# Patient Record
Sex: Female | Born: 1985 | Race: Black or African American | Hispanic: No | Marital: Single | Smoking: Current every day smoker
Health system: Southern US, Community
[De-identification: ages and names within clinical notes are randomized; demographics above are authoritative.]

---

## 2014-07-07 ENCOUNTER — Emergency Department (HOSPITAL_BASED_OUTPATIENT_CLINIC_OR_DEPARTMENT_OTHER): Payer: 59

## 2014-07-07 ENCOUNTER — Emergency Department (HOSPITAL_BASED_OUTPATIENT_CLINIC_OR_DEPARTMENT_OTHER)
Admission: EM | Admit: 2014-07-07 | Discharge: 2014-07-07 | Disposition: A | Discharge status: Home / Self Care | Payer: 59 | Attending: Emergency Medicine | Admitting: Emergency Medicine

## 2014-07-07 ENCOUNTER — Encounter (HOSPITAL_BASED_OUTPATIENT_CLINIC_OR_DEPARTMENT_OTHER): Payer: Self-pay | Admitting: Emergency Medicine

## 2014-07-07 DIAGNOSIS — J069 Acute upper respiratory infection, unspecified: Secondary | ICD-10-CM | POA: Diagnosis not present

## 2014-07-07 DIAGNOSIS — Z720 Tobacco use: Secondary | ICD-10-CM | POA: Diagnosis not present

## 2014-07-07 DIAGNOSIS — R1110 Vomiting, unspecified: Secondary | ICD-10-CM | POA: Insufficient documentation

## 2014-07-07 DIAGNOSIS — R05 Cough: Secondary | ICD-10-CM | POA: Diagnosis present

## 2014-07-07 NOTE — ED Notes (Signed)
Cough and congestion x 1 week ago

## 2014-07-07 NOTE — ED Notes (Signed)
Pt has cough for one week. No fever.  No runny nose.  Some chest soreness with coughing.  No sob.

## 2014-07-07 NOTE — Discharge Instructions (Signed)
Upper Respiratory Infection, Adult An upper respiratory infection (URI) is also sometimes known as the common cold. The upper respiratory tract includes the nose, sinuses, throat, trachea, and bronchi. Bronchi are the airways leading to the lungs. Most people improve within 1 week, but symptoms can last up to 2 weeks. A residual cough may last even longer.  CAUSES Many different viruses can infect the tissues lining the upper respiratory tract. The tissues become irritated and inflamed and often become very moist. Mucus production is also common. A cold is contagious. You can easily spread the virus to others by oral contact. This includes kissing, sharing a glass, coughing, or sneezing. Touching your mouth or nose and then touching a surface, which is then touched by another person, can also spread the virus. SYMPTOMS  Symptoms typically develop 1 to 3 days after you come in contact with a cold virus. Symptoms vary from person to person. They may include:  Runny nose.  Sneezing.  Nasal congestion.  Sinus irritation.  Sore throat.  Loss of voice (laryngitis).  Cough.  Fatigue.  Muscle aches.  Loss of appetite.  Headache.  Low-grade fever. DIAGNOSIS  You might diagnose your own cold based on familiar symptoms, since most people get a cold 2 to 3 times a year. Your caregiver can confirm this based on your exam. Most importantly, your caregiver can check that your symptoms are not due to another disease such as strep throat, sinusitis, pneumonia, asthma, or epiglottitis. Blood tests, throat tests, and X-rays are not necessary to diagnose a common cold, but they may sometimes be helpful in excluding other more serious diseases. Your caregiver will decide if any further tests are required. RISKS AND COMPLICATIONS  You may be at risk for a more severe case of the common cold if you smoke cigarettes, have chronic heart disease (such as heart failure) or lung disease (such as asthma), or if  you have a weakened immune system. The very young and very old are also at risk for more serious infections. Bacterial sinusitis, middle ear infections, and bacterial pneumonia can complicate the common cold. The common cold can worsen asthma and chronic obstructive pulmonary disease (COPD). Sometimes, these complications can require emergency medical care and may be life-threatening. PREVENTION  The best way to protect against getting a cold is to practice good hygiene. Avoid oral or hand contact with people with cold symptoms. Wash your hands often if contact occurs. There is no clear evidence that vitamin C, vitamin E, echinacea, or exercise reduces the chance of developing a cold. However, it is always recommended to get plenty of rest and practice good nutrition. TREATMENT  Treatment is directed at relieving symptoms. There is no cure. Antibiotics are not effective, because the infection is caused by a virus, not by bacteria. Treatment may include:  Increased fluid intake. Sports drinks offer valuable electrolytes, sugars, and fluids.  Breathing heated mist or steam (vaporizer or shower).  Eating chicken soup or other clear broths, and maintaining good nutrition.  Getting plenty of rest.  Using gargles or lozenges for comfort.  Controlling fevers with ibuprofen or acetaminophen as directed by your caregiver.  Increasing usage of your inhaler if you have asthma. Zinc gel and zinc lozenges, taken in the first 24 hours of the common cold, can shorten the duration and lessen the severity of symptoms. Pain medicines may help with fever, muscle aches, and throat pain. A variety of non-prescription medicines are available to treat congestion and runny nose. Your caregiver   can make recommendations and may suggest nasal or lung inhalers for other symptoms.  HOME CARE INSTRUCTIONS   Only take over-the-counter or prescription medicines for pain, discomfort, or fever as directed by your  caregiver.  Use a warm mist humidifier or inhale steam from a shower to increase air moisture. This may keep secretions moist and make it easier to breathe.  Drink enough water and fluids to keep your urine clear or pale yellow.  Rest as needed.  Return to work when your temperature has returned to normal or as your caregiver advises. You may need to stay home longer to avoid infecting others. You can also use a face mask and careful hand washing to prevent spread of the virus. SEEK MEDICAL CARE IF:   After the first few days, you feel you are getting worse rather than better.  You need your caregiver's advice about medicines to control symptoms.  You develop chills, worsening shortness of breath, or brown or red sputum. These may be signs of pneumonia.  You develop yellow or brown nasal discharge or pain in the face, especially when you bend forward. These may be signs of sinusitis.  You develop a fever, swollen neck glands, pain with swallowing, or white areas in the back of your throat. These may be signs of strep throat. SEEK IMMEDIATE MEDICAL CARE IF:   You have a fever.  You develop severe or persistent headache, ear pain, sinus pain, or chest pain.  You develop wheezing, a prolonged cough, cough up blood, or have a change in your usual mucus (if you have chronic lung disease).  You develop sore muscles or a stiff neck. Document Released: 07/05/2000 Document Revised: 04/03/2011 Document Reviewed: 04/16/2013 ExitCare Patient Information 2015 [employer]. This information is not intended to replace advice given to you by your health care provider. Make sure you discuss any questions you have with your health care provider.  

## 2014-07-07 NOTE — ED Provider Notes (Signed)
CSN: [id number]     Arrival date & time 07/07/14  1017 History   First MD Initiated Contact with Patient 07/07/14 1149     Chief Complaint  Patient presents with  . URI     (Consider location/radiation/quality/duration/timing/severity/associated sxs/prior Treatment) Patient is a 29 y.o. female presenting with URI. The history is provided by the patient.  URI Presenting symptoms: congestion and cough   Presenting symptoms: no fever   Severity:  Moderate Onset quality:  Gradual Timing:  Constant Progression:  Worsening Chronicity:  New Relieved by:  Nothing Ineffective treatments:  OTC medications   Deanna Mccormick is a 29 yo F with p/w cough and congestion. This has been occuring for one week. It seems to be getting worse.  She coughs so much that she will have emesis. She has tried OTC medications but with no relief. She has had sick contacts.  Denies any fever, chest pain, shortness of breath, diarrhea, constipation, dysuria, or rashes.   No past medical history on file. No past surgical history on file. No family history on file. History  Substance Use Topics  . Smoking status: Current Every Day Smoker  . Smokeless tobacco: Not on file  . Alcohol Use: Not on file   OB History    No data available     Review of Systems  Constitutional: Negative for fever and chills.  HENT: Positive for congestion.   Respiratory: Positive for cough. Negative for shortness of breath.   Cardiovascular: Negative for chest pain.  Gastrointestinal: Positive for vomiting. Negative for nausea, diarrhea and constipation.  Skin: Negative for rash.  Neurological: Negative for seizures and weakness.      Allergies  Review of patient's allergies indicates no known allergies.  Home Medications   Prior to Admission medications   Not on File   BP 109/74 mmHg  Pulse 73  Temp(Src) 98.4 F (36.9 C) (Oral)  Resp 20  Ht  (1.778 m)  Wt 200 lb (90.719 kg)  BMI 28.70 kg/m2  SpO2 97%   LMP 06/21/2014 Physical Exam  Constitutional: She is oriented to person, place, and time. She appears well-developed and well-nourished.  HENT:  Head: Normocephalic and atraumatic.  Boggy turbinates b/l   Eyes: Conjunctivae and EOM are normal.  Neck: Normal range of motion. Neck supple.  Cardiovascular: Normal rate, regular rhythm, normal heart sounds and intact distal pulses.   Pulmonary/Chest: Effort normal and breath sounds normal. She has no wheezes. She has no rales.  Musculoskeletal: Normal range of motion.  Lymphadenopathy:    She has no cervical adenopathy.  Neurological: She is alert and oriented to person, place, and time.  Skin: Skin is warm. No rash noted.    ED Course  Procedures (including critical care time) Labs Review Labs Reviewed - No data to display  Imaging Review Dg Chest 2 View  07/07/2014   CLINICAL DATA:  Cough and congestion for week.  Smoking history.  EXAM: CHEST  2 VIEW  COMPARISON:  None.  FINDINGS: The heart size and mediastinal contours are within normal limits. Both lungs are clear. The visualized skeletal structures are unremarkable.  IMPRESSION: Negative chest.   Electronically Signed   By: Marnee Spring M.D.   On: 07/07/2014 12:14     EKG Interpretation None      MDM   Final diagnoses:  URI (upper respiratory infection)   Deanna Mccormick is a 30 yo F that is p/w symptoms most consistent with a URI. Encouraged symptomatic management and  to f/u with her primary care if no improvement.   Myra Rude, MD PGY-2, [facility] 07/07/2014, 1:15 PM      Myra Rude, MD 07/07/14 1315  Tilden Fossa, MD 07/08/14 [phone]

## 2019-01-20 ENCOUNTER — Emergency Department (HOSPITAL_BASED_OUTPATIENT_CLINIC_OR_DEPARTMENT_OTHER)
Admission: EM | Admit: 2019-01-20 | Discharge: 2019-01-20 | Disposition: A | Discharge status: Home / Self Care | Payer: 59 | Attending: Emergency Medicine | Admitting: Emergency Medicine

## 2019-01-20 ENCOUNTER — Encounter (HOSPITAL_BASED_OUTPATIENT_CLINIC_OR_DEPARTMENT_OTHER): Payer: Self-pay | Admitting: *Deleted

## 2019-01-20 ENCOUNTER — Other Ambulatory Visit: Payer: Self-pay

## 2019-01-20 DIAGNOSIS — F17200 Nicotine dependence, unspecified, uncomplicated: Secondary | ICD-10-CM | POA: Insufficient documentation

## 2019-01-20 DIAGNOSIS — R05 Cough: Secondary | ICD-10-CM | POA: Diagnosis not present

## 2019-01-20 DIAGNOSIS — Z20828 Contact with and (suspected) exposure to other viral communicable diseases: Secondary | ICD-10-CM | POA: Diagnosis not present

## 2019-01-20 DIAGNOSIS — R059 Cough, unspecified: Secondary | ICD-10-CM

## 2019-01-20 NOTE — ED Triage Notes (Signed)
Cough and headache since yesterday. Her son had a covid exposure.

## 2019-01-20 NOTE — ED Provider Notes (Signed)
[facility] Emergency Department Provider Note MRN:  [id number]  Arrival date & time: 01/20/19     Chief Complaint   Cough   History of Present Illness   Deanna Mccormick is a 33 y.o. year-old female with no pertinent past medical history presenting to the ED with chief complaint of cough.  Cough, dull frontal headache, mild, malaise starting today.  Explains that her son has had similar symptoms for 2 days and has had exposure to his dad's girlfriend, who was diagnosed with coronavirus recently.  Patient denies any other symptoms, no vomiting, no chest pain, no shortness of breath, no abdominal pain, no diarrhea.  Symptoms constant, no exacerbating or alleviating factors.  Review of Systems  A complete 10 system review of systems was obtained and all systems are negative except as noted in the HPI and PMH.   Patient's Health History   History reviewed. No pertinent past medical history.  Past Surgical History:  Procedure Laterality Date  . CESAREAN SECTION      No family history on file.  Social History   Socioeconomic History  . Marital status: Single    Spouse name: Not on file  . Number of children: Not on file  . Years of education: Not on file  . Highest education level: Not on file  Occupational History  . Not on file  Tobacco Use  . Smoking status: Current Every Day Smoker  . Smokeless tobacco: Never Used  Substance and Sexual Activity  . Alcohol use: Yes  . Drug use: Never  . Sexual activity: Not on file  Other Topics Concern  . Not on file  Social History Narrative  . Not on file   Social Determinants of Health   Financial Resource Strain:   . Difficulty of Paying Living Expenses: Not on file  Food Insecurity:   . Worried About Charity fundraiser in the Last Year: Not on file  . Ran Out of Food in the Last Year: Not on file  Transportation Needs:   . Lack of Transportation (Medical): Not on file  . Lack of Transportation  (Non-Medical): Not on file  Physical Activity:   . Days of Exercise per Week: Not on file  . Minutes of Exercise per Session: Not on file  Stress:   . Feeling of Stress : Not on file  Social Connections:   . Frequency of Communication with Friends and Family: Not on file  . Frequency of Social Gatherings with Friends and Family: Not on file  . Attends Religious Services: Not on file  . Active Member of Clubs or Organizations: Not on file  . Attends Archivist Meetings: Not on file  . Marital Status: Not on file  Intimate Partner Violence:   . Fear of Current or Ex-Partner: Not on file  . Emotionally Abused: Not on file  . Physically Abused: Not on file  . Sexually Abused: Not on file     Physical Exam  Vital Signs and Nursing Notes reviewed Vitals:   01/20/19 1803  BP: 113/76  Pulse: 78  Resp: 20  Temp: 98.7 F (37.1 C)  SpO2: 99%    CONSTITUTIONAL: Well-appearing, NAD NEURO:  Alert and oriented x 3, no focal deficits EYES:  eyes equal and reactive ENT/NECK:  no LAD, no JVD CARDIO: Regular rate, well-perfused, normal S1 and S2 PULM:  CTAB no wheezing or rhonchi GI/GU:  normal bowel sounds, non-distended, non-tender MSK/SPINE:  No gross deformities, no edema  SKIN:  no rash, atraumatic PSYCH:  Appropriate speech and behavior  Diagnostic and Interventional Summary    EKG Interpretation  Date/Time:    Ventricular Rate:    PR Interval:    QRS Duration:   QT Interval:    QTC Calculation:   R Axis:     Text Interpretation:        Labs Reviewed  NOVEL CORONAVIRUS, NAA (HOSP ORDER, SEND-OUT TO REF LAB; TAT 18-24 HRS)    No orders to display    Medications - No data to display   Procedures  /  Critical Care Procedures  ED Course and Medical Decision Making  I have reviewed the triage vital signs and the nursing notes.  Pertinent labs & imaging results that were available during my care of the patient were reviewed by me and considered in my  medical decision making (see below for details).     Normal vital signs, no hypoxia, clear lungs, no increased work of breathing, mild viral symptoms, possibly coronavirus given exposure, will swab here, appropriate for home quarantine.  Deanna Mccormick was evaluated in Emergency Department on 01/20/2019 for the symptoms described in the history of present illness. She was evaluated in the context of the global COVID-19 pandemic, which necessitated consideration that the patient might be at risk for infection with the SARS-CoV-2 virus that causes COVID-19. Institutional protocols and algorithms that pertain to the evaluation of patients at risk for COVID-19 are in a state of rapid change based on information released by regulatory bodies including the CDC and federal and state organizations. These policies and algorithms were followed during the patient's care in the ED.     Elmer Sow. Pilar Plate, MD [facility] Emergency Medicine [facility] [email or web address]  Final Clinical Impressions(s) / ED Diagnoses     ICD-10-CM   1. Cough  R05     ED Discharge Orders    None       Discharge Instructions Discussed with and Provided to Patient:     Discharge Instructions     You were evaluated in the Emergency Department and after careful evaluation, we did not find any emergent condition requiring admission or further testing in the hospital.  Your exam/testing today is overall reassuring.  Your symptoms seem to be due to a viral illness, possibly the coronavirus.  We have tested you for the coronavirus here in the Emergency Department.  Please isolate or quarantine at home until you receive a negative test result.  If positive, we recommend continued home quarantine per [facility] recommendations.   Please return to the Emergency Department if you experience any worsening of your condition.  We encourage you to follow up with a primary care provider.  Thank you for allowing [place] to be  a part of your care.       Sabas Sous, MD 01/20/19 Deanna Mccormick

## 2019-01-20 NOTE — Discharge Instructions (Addendum)
You were evaluated in the Emergency Department and after careful evaluation, we did not find any emergent condition requiring admission or further testing in the hospital.  Your exam/testing today is overall reassuring.  Your symptoms seem to be due to a viral illness, possibly the coronavirus.  We have tested you for the coronavirus here in the Emergency Department.  Please isolate or quarantine at home until you receive a negative test result.  If positive, we recommend continued home quarantine per CDC recommendations.   Please return to the Emergency Department if you experience any worsening of your condition.  We encourage you to follow up with a primary care provider.  Thank you for allowing us to be a part of your care. 

## 2019-01-20 NOTE — ED Notes (Signed)
ED Provider at bedside. 

## 2019-01-21 ENCOUNTER — Telehealth: Payer: Self-pay

## 2019-01-21 NOTE — Telephone Encounter (Signed)
Received call from patient checking Covid results.  Advised no results at this time.   

## 2019-01-21 NOTE — Telephone Encounter (Signed)
Pt called to get test results for coivid. Test results still pending.   [place]

## 2019-01-22 LAB — NOVEL CORONAVIRUS, NAA (HOSP ORDER, SEND-OUT TO REF LAB; TAT 18-24 HRS): SARS-CoV-2, NAA: NOT DETECTED

## 2020-04-07 ENCOUNTER — Telehealth: Admit: 2020-04-07 | Payer: PRIVATE HEALTH INSURANCE | Attending: Foot & Ankle Surgery | Primary: Family Medicine

## 2020-04-07 NOTE — Telephone Encounter
[facility] MESSAGETime of call:   10:01 AMCaller:   Clancy Caller's relationship to patient:    Self Calling from (pharmacy, hospital, agency, etc.):     Reason for call:   Patient called and was on hold for department, requested a call back to cancel and reschedule appointment of tomorrow 04/08/20, please reach out to patient at [phone]. Thank you      Donella Stade

## 2020-04-08 ENCOUNTER — Encounter: Admit: 2020-04-08 | Payer: PRIVATE HEALTH INSURANCE | Attending: Foot & Ankle Surgery | Primary: Family Medicine

## 2020-04-22 ENCOUNTER — Ambulatory Visit: Admit: 2020-04-22 | Payer: PRIVATE HEALTH INSURANCE | Attending: Foot & Ankle Surgery | Primary: Family Medicine

## 2020-04-22 DIAGNOSIS — M79669 Pain in unspecified lower leg: Secondary | ICD-10-CM

## 2020-06-03 ENCOUNTER — Encounter: Admit: 2020-06-03 | Payer: PRIVATE HEALTH INSURANCE | Attending: Pulmonary Disease | Primary: Family Medicine

## 2020-06-10 ENCOUNTER — Encounter: Admit: 2020-06-10 | Payer: PRIVATE HEALTH INSURANCE | Attending: Pulmonary Disease | Primary: Family Medicine

## 2020-06-16 NOTE — Progress Notes
No show

## 2021-05-04 ENCOUNTER — Inpatient Hospital Stay: Admit: 2021-05-04 | Discharge: 2021-05-04 | Payer: PRIVATE HEALTH INSURANCE | Primary: Family Medicine

## 2021-05-04 DIAGNOSIS — N644 Mastodynia: Secondary | ICD-10-CM

## 2021-05-13 ENCOUNTER — Encounter: Admit: 2021-05-13 | Payer: PRIVATE HEALTH INSURANCE | Attending: Obstetrics and Gynecology | Primary: Family Medicine

## 2021-05-13 DIAGNOSIS — N6320 Unspecified lump in the left breast, unspecified quadrant: Secondary | ICD-10-CM

## 2021-06-23 ENCOUNTER — Encounter (HOSPITAL_BASED_OUTPATIENT_CLINIC_OR_DEPARTMENT_OTHER): Payer: Self-pay | Admitting: Urology

## 2021-06-23 ENCOUNTER — Emergency Department (HOSPITAL_BASED_OUTPATIENT_CLINIC_OR_DEPARTMENT_OTHER)
Admission: EM | Admit: 2021-06-23 | Discharge: 2021-06-24 | Disposition: A | Discharge status: Home / Self Care | Payer: BC Managed Care – PPO | Attending: Emergency Medicine | Admitting: Emergency Medicine

## 2021-06-23 ENCOUNTER — Emergency Department (HOSPITAL_BASED_OUTPATIENT_CLINIC_OR_DEPARTMENT_OTHER): Payer: BC Managed Care – PPO

## 2021-06-23 ENCOUNTER — Other Ambulatory Visit: Payer: Self-pay

## 2021-06-23 DIAGNOSIS — R1013 Epigastric pain: Secondary | ICD-10-CM | POA: Diagnosis present

## 2021-06-23 DIAGNOSIS — D649 Anemia, unspecified: Secondary | ICD-10-CM | POA: Diagnosis not present

## 2021-06-23 DIAGNOSIS — K8020 Calculus of gallbladder without cholecystitis without obstruction: Secondary | ICD-10-CM | POA: Diagnosis not present

## 2021-06-23 LAB — COMPREHENSIVE METABOLIC PANEL
ALT: 10 U/L (ref 0–44)
AST: 14 U/L — ABNORMAL LOW (ref 15–41)
Albumin: 3.3 g/dL — ABNORMAL LOW (ref 3.5–5.0)
Alkaline Phosphatase: 55 U/L (ref 38–126)
Anion gap: 4 — ABNORMAL LOW (ref 5–15)
BUN: 7 mg/dL (ref 6–20)
CO2: 25 mmol/L (ref 22–32)
Calcium: 8.7 mg/dL — ABNORMAL LOW (ref 8.9–10.3)
Chloride: 108 mmol/L (ref 98–111)
Creatinine, Ser: 0.67 mg/dL (ref 0.44–1.00)
GFR, Estimated: >60 mL/min (ref >60)
Glucose, Bld: 88 mg/dL (ref 70–99)
Potassium: 3.3 mmol/L — ABNORMAL LOW (ref 3.5–5.1)
Sodium: 137 mmol/L (ref 135–145)
Total Bilirubin: 0.6 mg/dL (ref 0.3–1.2)
Total Protein: 7.6 g/dL (ref 6.5–8.1)

## 2021-06-23 LAB — CBC WITH DIFFERENTIAL/PLATELET
Abs Immature Granulocytes: 0.01 10*3/uL (ref 0.00–0.07)
Basophils Absolute: 0 10*3/uL (ref 0.0–0.1)
Basophils Relative: 0 %
Eosinophils Absolute: 0.1 10*3/uL (ref 0.0–0.5)
Eosinophils Relative: 2 %
HCT: 35.4 % — ABNORMAL LOW (ref 36.0–46.0)
Hemoglobin: 11.9 g/dL — ABNORMAL LOW (ref 12.0–15.0)
Immature Granulocytes: 0 %
Lymphocytes Relative: 44 %
Lymphs Abs: 2.4 10*3/uL (ref 0.7–4.0)
MCH: 27.6 pg (ref 26.0–34.0)
MCHC: 33.6 g/dL (ref 30.0–36.0)
MCV: 82.1 fL (ref 80.0–100.0)
Monocytes Absolute: 0.3 10*3/uL (ref 0.1–1.0)
Monocytes Relative: 6 %
Neutro Abs: 2.5 10*3/uL (ref 1.7–7.7)
Neutrophils Relative %: 48 %
Platelets: 246 10*3/uL (ref 150–400)
RBC: 4.31 MIL/uL (ref 3.87–5.11)
RDW: 14.5 % (ref 11.5–15.5)
WBC: 5.3 10*3/uL (ref 4.0–10.5)
nRBC: 0 % (ref 0.0–0.2)

## 2021-06-23 LAB — URINALYSIS, ROUTINE W REFLEX MICROSCOPIC
Bilirubin Urine: NEGATIVE
Glucose, UA: NEGATIVE mg/dL
Hgb urine dipstick: NEGATIVE
Ketones, ur: NEGATIVE mg/dL
Leukocytes,Ua: NEGATIVE
Nitrite: NEGATIVE
Protein, ur: NEGATIVE mg/dL
Specific Gravity, Urine: >=1.03 (ref 1.005–1.030)
pH: 5.5 (ref 5.0–8.0)

## 2021-06-23 LAB — LIPASE, BLOOD: Lipase: 30 U/L (ref 11–51)

## 2021-06-23 LAB — PREGNANCY, URINE: Preg Test, Ur: NEGATIVE

## 2021-06-23 NOTE — ED Notes (Signed)
ED Provider at bedside. 

## 2021-06-23 NOTE — ED Triage Notes (Signed)
Pt states acid reflux worsening and becoming more frequent over past week  States pain worse after eating  Denies N/V, feels better after burping and passing gas  States pain does radiate to her chest and causes some  SOB at times

## 2021-06-23 NOTE — ED Provider Notes (Signed)
   [facility] EMERGENCY DEPARTMENT  Provider Note  CSN: [id number] Arrival date & time: 06/23/21 2104  History Chief Complaint  Patient presents with   Gastroesophageal Reflux    Deanna Mccormick is a 35 y.o. female with history of heart burn/Reflux managed by OTC meds reports increased frequency of epigastric discomfort over the last several days, worse after eating. Denies N/V/D/C but has had increased belching and flatus. She denies any fevers.    Home Medications Prior to Admission medications   Not on File     Allergies    Amoxicillin   Review of Systems   Review of Systems Please see HPI for pertinent positives and negatives  Physical Exam BP 123/74 (BP Location: Right Arm)   Pulse 85   Temp 98 F (36.7 C) (Oral)   Resp 18   Ht 5\' 8"  (1.727 m)   Wt 83.9 kg   SpO2 100%   BMI 28.13 kg/m   Physical Exam Vitals and nursing note reviewed.  Constitutional:      Appearance: Normal appearance.  HENT:     Head: Normocephalic and atraumatic.     Nose: Nose normal.     Mouth/Throat:     Mouth: Mucous membranes are moist.  Eyes:     Extraocular Movements: Extraocular movements intact.     Conjunctiva/sclera: Conjunctivae normal.  Cardiovascular:     Rate and Rhythm: Normal rate.  Pulmonary:     Effort: Pulmonary effort is normal.     Breath sounds: Normal breath sounds.  Abdominal:     General: Abdomen is flat.     Palpations: Abdomen is soft.     Tenderness: There is abdominal tenderness in the right upper quadrant and epigastric area. There is no guarding. Negative signs include Murphy's sign and McBurney's sign.  Musculoskeletal:        General: No swelling. Normal range of motion.     Cervical back: Neck supple.  Skin:    General: Skin is warm and dry.  Neurological:     General: No focal deficit present.     Mental Status: She is alert.  Psychiatric:        Mood and Affect: Mood normal.    ED Results / Procedures / Treatments    EKG None  Procedures Procedures  Medications Ordered in the ED Medications - No data to display  Initial Impression and Plan  Patient here with several days of post-prandial epigastric pain. Some RUQ tenderness as well. History of self-diagnosed GERD takes OTC meds usually but not helping today. Will check labs and send for to evaluate hepatobiliary etiology. Could also be GERD, PUD, gastritis. Patient declines pain/nausea meds at this time.   ED Course       MDM Rules/Calculators/A&P Medical Decision Making Amount and/or Complexity of Data Reviewed Labs: ordered. Radiology: ordered.    Final Clinical Impression(s) / ED Diagnoses Final diagnoses:  None    Rx / DC Orders ED Discharge Orders     None

## 2021-06-24 ENCOUNTER — Other Ambulatory Visit (HOSPITAL_BASED_OUTPATIENT_CLINIC_OR_DEPARTMENT_OTHER): Payer: Self-pay

## 2021-06-24 DIAGNOSIS — K8020 Calculus of gallbladder without cholecystitis without obstruction: Secondary | ICD-10-CM | POA: Diagnosis not present

## 2021-06-24 MED ORDER — ALUM & MAG HYDROXIDE-SIMETH 200-200-20 MG/5ML PO SUSP
30.0000 mL | Freq: Once | ORAL | Status: AC
  Administered 2021-06-24: 30 mL via ORAL
  Filled 2021-06-24: qty 30

## 2021-06-24 MED ORDER — HYDROCODONE-ACETAMINOPHEN 5-325 MG PO TABS
1.0000 | ORAL_TABLET | Freq: Four times a day (QID) | ORAL | 0 refills | Status: DC | PRN
  Filled 2021-06-24: qty 12, 3d supply, fill #0

## 2021-06-24 MED ORDER — ONDANSETRON 4 MG PO TBDP
4.0000 mg | ORAL_TABLET | Freq: Three times a day (TID) | ORAL | 0 refills | Status: AC | PRN
Start: 2021-06-24 — End: ?
  Filled 2021-06-24: qty 18, 6d supply, fill #0

## 2021-06-28 ENCOUNTER — Other Ambulatory Visit: Payer: Self-pay | Admitting: Surgery

## 2021-07-13 NOTE — Patient Instructions (Signed)
DUE TO COVID-19 ONLY TWO VISITORS  (aged 34 and older)  ARE ALLOWED TO COME WITH YOU AND STAY IN THE WAITING ROOM ONLY DURING PRE OP AND PROCEDURE.   **NO VISITORS ARE ALLOWED IN THE SHORT STAY AREA OR RECOVERY ROOM!!**  IF YOU WILL BE ADMITTED INTO THE HOSPITAL YOU ARE ALLOWED ONLY FOUR SUPPORT PEOPLE DURING VISITATION HOURS ONLY (7 AM -8PM)   The support person(s) must pass our screening, gel in and out, and wear a mask at all times, including in the patient's room. Patients must also wear a mask when staff or their support person are in the room. Visitors GUEST BADGE MUST BE WORN VISIBLY  One adult visitor may remain with you overnight and MUST be in the room by 8 P.M.     Your procedure is scheduled on: 07/28/21   Report to [facility] Main Entrance    Report to admitting at : 5:15 AM   Call this number if you have problems the morning of surgery [phone]  Eat a light diet the day before surgery.  Examples including soups, broths, toast, yogurt, mashed potatoes.  Things to avoid include carbonated beverages (fizzy beverages), raw fruits and raw vegetables, or beans.   If your bowels are filled with gas, your surgeon will have difficulty visualizing your pelvic organs which increases your surgical risks.    Do not eat food :After Midnight.   After Midnight you may have the following liquids until : 4:30 AM DAY OF SURGERY  Water Black Coffee (sugar ok, NO MILK/CREAM OR CREAMERS)  Tea (sugar ok, NO MILK/CREAM OR CREAMERS) regular and decaf                             Plain Jell-O (NO RED)                                           Fruit ices (not with fruit pulp, NO RED)                                     Popsicles (NO RED)                                                                  Juice: apple, WHITE grape, WHITE cranberry Sports drinks like Gatorade (NO RED) Clear broth(vegetable,chicken,beef)   Oral Hygiene is also important to reduce your risk of infection.                                     Remember - BRUSH YOUR TEETH THE MORNING OF SURGERY WITH YOUR REGULAR TOOTHPASTE   Do NOT smoke after Midnight   Take these medicines the morning of surgery with A SIP OF WATER: N/A  DO NOT TAKE ANY ORAL DIABETIC MEDICATIONS DAY OF YOUR SURGERY  Bring CPAP mask and tubing day of surgery.  You may not have any metal on your body including hair pins, jewelry, and body piercing             Do not wear make-up, lotions, powders, perfumes/cologne, or deodorant  Do not wear nail polish including gel and S&S, artificial/acrylic nails, or any other type of covering on natural nails including finger and toenails. If you have artificial nails, gel coating, etc. that needs to be removed by a nail salon please have this removed prior to surgery or surgery may need to be canceled/ delayed if the surgeon/ anesthesia feels like they are unable to be safely monitored.   Do not shave  48 hours prior to surgery.    Do not bring valuables to the hospital. [place].   Contacts, dentures or bridgework may not be worn into surgery.   Bring small overnight bag day of surgery.   DO NOT [place]. PHARMACY WILL DISPENSE MEDICATIONS LISTED ON YOUR MEDICATION LIST TO YOU DURING YOUR ADMISSION [place]!    Patients discharged on the day of surgery will not be allowed to drive home.  Someone NEEDS to stay with you for the first 24 hours after anesthesia.   Special Instructions: Bring a copy of your healthcare power of attorney and living will documents         the day of surgery if you haven't scanned them before.              Please read over the following fact sheets you were given: IF YOU HAVE QUESTIONS ABOUT YOUR PRE-OP INSTRUCTIONS PLEASE CALL [phone]     [facility] - Preparing for Surgery Before surgery, you can play an important role.  Because skin  is not sterile, your skin needs to be as free of germs as possible.  You can reduce the number of germs on your skin by washing with CHG (chlorahexidine gluconate) soap before surgery.  CHG is an antiseptic cleaner which kills germs and bonds with the skin to continue killing germs even after washing. Please DO NOT use if you have an allergy to CHG or antibacterial soaps.  If your skin becomes reddened/irritated stop using the CHG and inform your nurse when you arrive at Short Stay. Do not shave (including legs and underarms) for at least 48 hours prior to the first CHG shower.  You may shave your face/neck. Please follow these instructions carefully:  1.  Shower with CHG Soap the night before surgery and the  morning of Surgery.  2.  If you choose to wash your hair, wash your hair first as usual with your  normal  shampoo.  3.  After you shampoo, rinse your hair and body thoroughly to remove the  shampoo.                           4.  Use CHG as you would any other liquid soap.  You can apply chg directly  to the skin and wash                       Gently with a scrungie or clean washcloth.  5.  Apply the CHG Soap to your body ONLY FROM THE NECK DOWN.   Do not use on face/ open  Wound or open sores. Avoid contact with eyes, ears mouth and genitals (private parts).                       Wash face,  Genitals (private parts) with your normal soap.             6.  Wash thoroughly, paying special attention to the area where your surgery  will be performed.  7.  Thoroughly rinse your body with warm water from the neck down.  8.  DO NOT shower/wash with your normal soap after using and rinsing off  the CHG Soap.                9.  Pat yourself dry with a clean towel.            10.  Wear clean pajamas.            11.  Place clean sheets on your bed the night of your first shower and do not  sleep with pets. Day of Surgery : Do not apply any lotions/deodorants the morning of  surgery.  Please wear clean clothes to the hospital/surgery center.  FAILURE TO FOLLOW THESE INSTRUCTIONS MAY RESULT IN THE CANCELLATION OF YOUR SURGERY PATIENT SIGNATURE_________________________________  NURSE SIGNATURE__________________________________  ________________________________________________________________________

## 2021-07-14 ENCOUNTER — Encounter (HOSPITAL_COMMUNITY): Payer: Self-pay

## 2021-07-14 ENCOUNTER — Encounter (HOSPITAL_COMMUNITY)
Admission: RE | Admit: 2021-07-14 | Discharge: 2021-07-14 | Disposition: A | Discharge status: Home / Self Care | Payer: BC Managed Care – PPO | Source: Ambulatory Visit | Attending: Surgery | Admitting: Surgery

## 2021-07-14 ENCOUNTER — Other Ambulatory Visit: Payer: Self-pay

## 2021-07-14 VITALS — BP 108/65 | HR 77 | Temp 98.1°F | Ht 68.0 in | Wt 180.0 lb

## 2021-07-14 DIAGNOSIS — Z01812 Encounter for preprocedural laboratory examination: Secondary | ICD-10-CM | POA: Diagnosis present

## 2021-07-14 DIAGNOSIS — Z01818 Encounter for other preprocedural examination: Secondary | ICD-10-CM

## 2021-07-14 LAB — CBC
HCT: 39.5 % (ref 36.0–46.0)
Hemoglobin: 12.8 g/dL (ref 12.0–15.0)
MCH: 27.5 pg (ref 26.0–34.0)
MCHC: 32.4 g/dL (ref 30.0–36.0)
MCV: 84.9 fL (ref 80.0–100.0)
Platelets: 241 10*3/uL (ref 150–400)
RBC: 4.65 MIL/uL (ref 3.87–5.11)
RDW: 14.4 % (ref 11.5–15.5)
WBC: 6.2 10*3/uL (ref 4.0–10.5)
nRBC: 0 % (ref 0.0–0.2)

## 2021-07-14 NOTE — Progress Notes (Signed)
For Short Stay: COVID SWAB appointment date: Date of COVID positive in last 90 days:  Bowel Prep reminder:   For Anesthesia: PCP - NO PCP Cardiologist -   Chest x-ray -  EKG - 06/29/21 Stress Test -  ECHO -  Cardiac Cath -  Pacemaker/ICD device last checked: Pacemaker orders received: Device Rep notified:  Spinal Cord Stimulator:  Sleep Study -  CPAP -   Fasting Blood Sugar -  Checks Blood Sugar _____ times a day Date and result of last Hgb A1c-  Blood Thinner Instructions: Aspirin Instructions: Last Dose:  Activity level: Can go up a flight of stairs and activities of daily living without stopping and without chest pain and/or shortness of breath   Able to exercise without chest pain and/or shortness of breath   Unable to go up a flight of stairs without chest pain and/or shortness of breath     Anesthesia review: Hx: Smoker  Patient denies shortness of breath, fever, cough and chest pain at PAT appointment   Patient verbalized understanding of instructions that were given to them at the PAT appointment. Patient was also instructed that they will need to review over the PAT instructions again at home before surgery.

## 2021-07-27 NOTE — Anesthesia Preprocedure Evaluation (Addendum)
Anesthesia Evaluation  Patient identified by MRN, date of birth, ID band Patient awake    Reviewed: Allergy & Precautions, NPO status , Patient's Chart, lab work & pertinent test results  Airway Mallampati: I  TM Distance: >3 FB Neck ROM: Full    Dental no notable dental hx.    Pulmonary neg pulmonary ROS, Current Smoker and Patient abstained from smoking.,    Pulmonary exam normal        Cardiovascular negative cardio ROS   Rhythm:Regular Rate:Normal     Neuro/Psych negative neurological ROS  negative psych ROS   GI/Hepatic Neg liver ROS, gallstones   Endo/Other  negative endocrine ROS  Renal/GU negative Renal ROS  negative genitourinary   Musculoskeletal negative musculoskeletal ROS (+)   Abdominal Normal abdominal exam  (+)   Peds  Hematology negative hematology ROS (+)   Anesthesia Other Findings   Reproductive/Obstetrics negative OB ROS                            Anesthesia Physical Anesthesia Plan  ASA: 1  Anesthesia Plan: General   Post-op Pain Management: Tylenol PO (pre-op)* and Celebrex PO (pre-op)*   Induction: Intravenous  PONV Risk Score and Plan: 2 and Ondansetron, Dexamethasone, Midazolam and Treatment may vary due to age or medical condition  Airway Management Planned: Mask and Oral ETT  Additional Equipment: None  Intra-op Plan:   Post-operative Plan: Extubation in OR  Informed Consent: I have reviewed the patients History and Physical, chart, labs and discussed the procedure including the risks, benefits and alternatives for the proposed anesthesia with the patient or authorized representative who has indicated his/her understanding and acceptance.     Dental advisory given  Plan Discussed with: CRNA  Anesthesia Plan Comments: (Lab Results      Component                Value               Date                      PREGTESTUR               NEGATIVE             07/28/2021          )       Anesthesia Quick Evaluation

## 2021-07-27 NOTE — H&P (Signed)
  REFERRING PHYSICIAN: Tenna Child,*  PROVIDER: Wayne Both, MD  MRN: [id number] DOB: 1985-11-12  Subjective   Chief Complaint: Gallstones   History of Present Illness: Deanna Mccormick is a 36 y.o. female who is seen as an office consultation for evaluation of symptomatic cholelithiasis .   She has been having intermittent attacks of epigastric abdominal pain with nausea but no emesis. There always after fatty meals. They have been fairly intermittent but now happening most every other day. She denies jaundice. The pain is moderate in intensity. She has no cardiopulmonary issues. She denies emesis. She presented to the emergency department where she had normal LFTs and normal WBC. Her ultrasound showed gallstones. She was thus referred here.  Review of Systems: A complete review of systems was obtained from the patient. I have reviewed this information and discussed as appropriate with the patient. See HPI as well for other ROS.  ROS   Medical History: History reviewed. No pertinent past medical history.  There is no problem list on file for this patient.  Past Surgical History:  Procedure Laterality Date  CESAREAN SECTION    Allergies  Allergen Reactions  Amoxicillin Hives   Current Outpatient Medications on File Prior to Visit  Medication Sig Dispense Refill  oxyCODONE (ROXICODONE) 5 MG immediate release tablet   No current facility-administered medications on file prior to visit.   History reviewed. No pertinent family history.   Social History   Tobacco Use  Smoking Status Former  Types: Cigarettes  Smokeless Tobacco Never    Social History   Socioeconomic History  Marital status: Single  Tobacco Use  Smoking status: Former  Types: Cigarettes  Smokeless tobacco: Never  Substance and Sexual Activity  Alcohol use: Yes  Drug use: Never   Objective:   Vitals:   BP: 126/80  Pulse: 97  Temp: 36.3 C (97.4 F)  SpO2: 97%   Weight: 83.5 kg (184 lb)  Height: 172.7 cm (5\' 8" )   Body mass index is 27.98 kg/m.  Physical Exam   She appears well on exam today  Her abdomen is soft and nontender  There is no hepatomegaly  Labs, Imaging and Diagnostic Testing: I reviewed her laboratory data and ultrasound report. She does have gallstones with the largest measuring 2.3 cm. Bile ducts are normal.  Assessment and Plan:   Diagnoses and all orders for this visit:  Symptomatic cholelithiasis    At this point I long discussion with the patient regarding gallbladder disease and symptomatic gallstones. I gave her literature regarding this. We next discussed surgery for removal of the gallbladder. This would be with a laparoscopic cholecystectomy. I explained the surgical procedure in detail. We discussed the risks which includes but is not limited to bleeding, infection, injury to surrounding structures, the need to convert to an open procedure, bile duct injury, bile leak, postoperative recovery, etc. She understands and wishes to proceed with surgery which will be scheduled.

## 2021-07-28 ENCOUNTER — Ambulatory Visit (HOSPITAL_COMMUNITY): Payer: BC Managed Care – PPO | Admitting: Anesthesiology

## 2021-07-28 ENCOUNTER — Encounter (HOSPITAL_COMMUNITY)
Admission: RE | Disposition: A | Discharge status: Home / Self Care | Payer: Self-pay | Source: Home / Self Care | Attending: Surgery

## 2021-07-28 ENCOUNTER — Other Ambulatory Visit: Payer: Self-pay

## 2021-07-28 ENCOUNTER — Encounter (HOSPITAL_COMMUNITY): Payer: Self-pay | Admitting: Surgery

## 2021-07-28 ENCOUNTER — Ambulatory Visit (HOSPITAL_COMMUNITY)
Admission: RE | Admit: 2021-07-28 | Discharge: 2021-07-28 | Disposition: A | Discharge status: Home / Self Care | Payer: BC Managed Care – PPO | Attending: Surgery | Admitting: Surgery

## 2021-07-28 DIAGNOSIS — Z01818 Encounter for other preprocedural examination: Secondary | ICD-10-CM

## 2021-07-28 DIAGNOSIS — K828 Other specified diseases of gallbladder: Secondary | ICD-10-CM | POA: Insufficient documentation

## 2021-07-28 DIAGNOSIS — K8010 Calculus of gallbladder with chronic cholecystitis without obstruction: Secondary | ICD-10-CM | POA: Diagnosis not present

## 2021-07-28 DIAGNOSIS — Z87891 Personal history of nicotine dependence: Secondary | ICD-10-CM | POA: Diagnosis not present

## 2021-07-28 HISTORY — PX: CHOLECYSTECTOMY: SHX55

## 2021-07-28 LAB — PREGNANCY, URINE: Preg Test, Ur: NEGATIVE

## 2021-07-28 SURGERY — LAPAROSCOPIC CHOLECYSTECTOMY
Anesthesia: General

## 2021-07-28 MED ORDER — FENTANYL CITRATE (PF) 100 MCG/2ML IJ SOLN
INTRAMUSCULAR | Status: AC
  Filled 2021-07-28: qty 2

## 2021-07-28 MED ORDER — SODIUM CHLORIDE 0.9 % IR SOLN
Status: DC | PRN
  Administered 2021-07-28: 1000 mL

## 2021-07-28 MED ORDER — LIDOCAINE HCL (PF) 2 % IJ SOLN
INTRAMUSCULAR | Status: AC
  Filled 2021-07-28: qty 25

## 2021-07-28 MED ORDER — ONDANSETRON HCL 4 MG/2ML IJ SOLN
INTRAMUSCULAR | Status: DC | PRN
  Administered 2021-07-28: 4 mg via INTRAVENOUS

## 2021-07-28 MED ORDER — CHLORHEXIDINE GLUCONATE 0.12 % MT SOLN
15.0000 mL | Freq: Once | OROMUCOSAL | Status: AC
  Administered 2021-07-28: 15 mL via OROMUCOSAL

## 2021-07-28 MED ORDER — FENTANYL CITRATE (PF) 100 MCG/2ML IJ SOLN
INTRAMUSCULAR | Status: DC | PRN
Start: 2021-07-28 — End: 2021-07-28
  Administered 2021-07-28: 100 ug via INTRAVENOUS
  Administered 2021-07-28 (×2): 50 ug via INTRAVENOUS
  Administered 2021-07-28: 100 ug via INTRAVENOUS

## 2021-07-28 MED ORDER — LACTATED RINGERS IV SOLN: INTRAVENOUS | Status: DC

## 2021-07-28 MED ORDER — BUPIVACAINE HCL (PF) 0.5 % IJ SOLN
INTRAMUSCULAR | Status: AC
  Filled 2021-07-28: qty 30

## 2021-07-28 MED ORDER — ONDANSETRON HCL 4 MG/2ML IJ SOLN
INTRAMUSCULAR | Status: AC
  Filled 2021-07-28: qty 2

## 2021-07-28 MED ORDER — ROCURONIUM BROMIDE 100 MG/10ML IV SOLN
INTRAVENOUS | Status: DC | PRN
  Administered 2021-07-28: 40 mg via INTRAVENOUS
  Administered 2021-07-28: 20 mg via INTRAVENOUS

## 2021-07-28 MED ORDER — BUPIVACAINE HCL (PF) 0.5 % IJ SOLN
INTRAMUSCULAR | Status: DC | PRN
  Administered 2021-07-28: 20 mL

## 2021-07-28 MED ORDER — SUGAMMADEX SODIUM 200 MG/2ML IV SOLN
INTRAVENOUS | Status: DC | PRN
  Administered 2021-07-28: 200 mg via INTRAVENOUS

## 2021-07-28 MED ORDER — ORAL CARE MOUTH RINSE: 15.0000 mL | Freq: Once | OROMUCOSAL | Status: AC

## 2021-07-28 MED ORDER — ROCURONIUM BROMIDE 10 MG/ML (PF) SYRINGE
PREFILLED_SYRINGE | INTRAVENOUS | Status: AC
  Filled 2021-07-28: qty 10

## 2021-07-28 MED ORDER — LACTATED RINGERS IR SOLN
Status: DC | PRN
  Administered 2021-07-28: 1000 mL

## 2021-07-28 MED ORDER — LIDOCAINE HCL (CARDIAC) PF 100 MG/5ML IV SOSY
PREFILLED_SYRINGE | INTRAVENOUS | Status: DC | PRN
  Administered 2021-07-28: 60 mg via INTRAVENOUS

## 2021-07-28 MED ORDER — KETAMINE HCL 10 MG/ML IJ SOLN
INTRAMUSCULAR | Status: AC
  Filled 2021-07-28: qty 1

## 2021-07-28 MED ORDER — INDOCYANINE GREEN 25 MG IV SOLR
INTRAVENOUS | Status: DC | PRN
  Administered 2021-07-28: 5 mg via INTRAVENOUS

## 2021-07-28 MED ORDER — INDOCYANINE GREEN 25 MG IV SOLR: INTRAVENOUS | Status: DC | PRN

## 2021-07-28 MED ORDER — DEXAMETHASONE SODIUM PHOSPHATE 10 MG/ML IJ SOLN
INTRAMUSCULAR | Status: DC | PRN
  Administered 2021-07-28: 8 mg via INTRAVENOUS

## 2021-07-28 MED ORDER — ACETAMINOPHEN 500 MG PO TABS
1000.0000 mg | ORAL_TABLET | Freq: Once | ORAL | Status: AC
  Administered 2021-07-28: 1000 mg via ORAL
  Filled 2021-07-28: qty 2

## 2021-07-28 MED ORDER — FENTANYL CITRATE PF 50 MCG/ML IJ SOSY
PREFILLED_SYRINGE | INTRAMUSCULAR | Status: AC
  Filled 2021-07-28: qty 2

## 2021-07-28 MED ORDER — CHLORHEXIDINE GLUCONATE CLOTH 2 % EX PADS: 6.0000 | MEDICATED_PAD | Freq: Once | CUTANEOUS | Status: DC

## 2021-07-28 MED ORDER — ACETAMINOPHEN 500 MG PO TABS: 1000.0000 mg | ORAL_TABLET | ORAL | Status: DC

## 2021-07-28 MED ORDER — PHENYLEPHRINE 80 MCG/ML (10ML) SYRINGE FOR IV PUSH (FOR BLOOD PRESSURE SUPPORT)
PREFILLED_SYRINGE | INTRAVENOUS | Status: AC
Start: 2021-07-28 — End: ?
  Filled 2021-07-28: qty 10

## 2021-07-28 MED ORDER — PHENYLEPHRINE HCL (PRESSORS) 10 MG/ML IV SOLN
INTRAVENOUS | Status: DC | PRN
  Administered 2021-07-28: 80 ug via INTRAVENOUS

## 2021-07-28 MED ORDER — PROPOFOL 10 MG/ML IV BOLUS
INTRAVENOUS | Status: AC
  Filled 2021-07-28: qty 20

## 2021-07-28 MED ORDER — PROPOFOL 10 MG/ML IV BOLUS
INTRAVENOUS | Status: DC | PRN
  Administered 2021-07-28: 150 mg via INTRAVENOUS

## 2021-07-28 MED ORDER — OXYCODONE HCL 5 MG PO TABS: 5.0000 mg | ORAL_TABLET | Freq: Four times a day (QID) | ORAL | 0 refills | Status: AC | PRN

## 2021-07-28 MED ORDER — CELECOXIB 200 MG PO CAPS
200.0000 mg | ORAL_CAPSULE | Freq: Once | ORAL | Status: AC
  Administered 2021-07-28: 200 mg via ORAL
  Filled 2021-07-28: qty 1

## 2021-07-28 MED ORDER — CIPROFLOXACIN IN D5W 400 MG/200ML IV SOLN
400.0000 mg | INTRAVENOUS | Status: AC
  Administered 2021-07-28: 400 mg via INTRAVENOUS
  Filled 2021-07-28: qty 200

## 2021-07-28 MED ORDER — MIDAZOLAM HCL 5 MG/5ML IJ SOLN
INTRAMUSCULAR | Status: DC | PRN
  Administered 2021-07-28: 2 mg via INTRAVENOUS

## 2021-07-28 MED ORDER — DEXAMETHASONE SODIUM PHOSPHATE 10 MG/ML IJ SOLN
INTRAMUSCULAR | Status: AC
  Filled 2021-07-28: qty 1

## 2021-07-28 MED ORDER — MIDAZOLAM HCL 2 MG/2ML IJ SOLN
INTRAMUSCULAR | Status: AC
Start: 2021-07-28 — End: ?
  Filled 2021-07-28: qty 2

## 2021-07-28 MED ORDER — FENTANYL CITRATE PF 50 MCG/ML IJ SOSY
25.0000 ug | PREFILLED_SYRINGE | INTRAMUSCULAR | Status: DC | PRN
  Administered 2021-07-28: 50 ug via INTRAVENOUS

## 2021-07-28 SURGICAL SUPPLY — 33 items
APPLIER CLIP 5 13 M/L LIGAMAX5 (MISCELLANEOUS) ×4
BAG COUNTER SPONGE SURGICOUNT (BAG) ×1 IMPLANT
CABLE HIGH FREQUENCY MONO STRZ (ELECTRODE) ×2 IMPLANT
CHLORAPREP W/TINT 26 (MISCELLANEOUS) ×2 IMPLANT
CLIP APPLIE 5 13 M/L LIGAMAX5 (MISCELLANEOUS) ×1 IMPLANT
COVER MAYO STAND XLG (MISCELLANEOUS) ×2 IMPLANT
DERMABOND ADVANCED (GAUZE/BANDAGES/DRESSINGS) ×1
DERMABOND ADVANCED .7 DNX12 (GAUZE/BANDAGES/DRESSINGS) ×1 IMPLANT
DRAPE C-ARM 42X120 X-RAY (DRAPES) IMPLANT
ELECT REM PT RETURN 15FT ADLT (MISCELLANEOUS) ×2 IMPLANT
GLOVE BIO SURGEON STRL SZ7.5 (GLOVE) ×2 IMPLANT
GOWN STRL REUS W/ TWL XL LVL3 (GOWN DISPOSABLE) ×2 IMPLANT
GOWN STRL REUS W/TWL XL LVL3 (GOWN DISPOSABLE) ×2
HEMOSTAT SURGICEL 4X8 (HEMOSTASIS) IMPLANT
IRRIG SUCT STRYKERFLOW 2 WTIP (MISCELLANEOUS) ×2
IRRIGATION SUCT STRKRFLW 2 WTP (MISCELLANEOUS) ×1 IMPLANT
KIT BASIN OR (CUSTOM PROCEDURE TRAY) ×2 IMPLANT
KIT IMAGING PINPOINTPAQ (MISCELLANEOUS) ×1 IMPLANT
KIT TURNOVER KIT A (KITS) ×1 IMPLANT
PENCIL SMOKE EVACUATOR (MISCELLANEOUS) IMPLANT
SCISSORS LAP 5X35 DISP (ENDOMECHANICALS) ×2 IMPLANT
SET CHOLANGIOGRAPH MIX (MISCELLANEOUS) IMPLANT
SET TUBE SMOKE EVAC HIGH FLOW (TUBING) ×2 IMPLANT
SLEEVE Z-THREAD 5X100MM (TROCAR) ×4 IMPLANT
SPIKE FLUID TRANSFER (MISCELLANEOUS) ×2 IMPLANT
SUT MNCRL AB 4-0 PS2 18 (SUTURE) ×2 IMPLANT
SYS BAG RETRIEVAL 10MM (BASKET) ×2
SYSTEM BAG RETRIEVAL 10MM (BASKET) ×1 IMPLANT
TOWEL OR 17X26 10 PK STRL BLUE (TOWEL DISPOSABLE) ×2 IMPLANT
TOWEL OR NON WOVEN STRL DISP B (DISPOSABLE) ×2 IMPLANT
TRAY LAPAROSCOPIC (CUSTOM PROCEDURE TRAY) ×2 IMPLANT
TROCAR BALLN 12MMX100 BLUNT (TROCAR) ×2 IMPLANT
TROCAR Z-THREAD OPTICAL 5X100M (TROCAR) ×2 IMPLANT

## 2021-07-28 NOTE — Interval H&P Note (Signed)
History and Physical Interval Note:no change in H and P  07/28/2021 7:05 AM  Deanna Mccormick  has presented today for surgery, with the diagnosis of SYMPTOMATIC GALLSTONES.  The various methods of treatment have been discussed with the patient and family. After consideration of risks, benefits and other options for treatment, the patient has consented to  Procedure(s): LAPAROSCOPIC CHOLECYSTECTOMY WITH ICG (N/A) as a surgical intervention.  The patient's history has been reviewed, patient examined, no change in status, stable for surgery.  I have reviewed the patient's chart and labs.  Questions were answered to the patient's satisfaction.     Abigail Miyamoto

## 2021-07-28 NOTE — Op Note (Signed)
LAPAROSCOPIC CHOLECYSTECTOMY WITH ICG  Procedure Note  Deanna Mccormick 07/28/2021   Pre-op Diagnosis: SYMPTOMATIC GALLSTONES     Post-op Diagnosis: same  Procedure(s): LAPAROSCOPIC CHOLECYSTECTOMY WITH ICG  Surgeon(s): Abigail Miyamoto, MD Zena Amos, MD  [facility] Resident  Anesthesia: General  Staff:  Circulator: Loletta Parish, RN Scrub Person: Gaynelle Arabian; George Hugh M, [place]  Estimated Blood Loss: Minimal               Specimens: sent to path  Findings: The patient was found to have a thick-walled gallbladder containing multiple large gallstones.  Procedure: The patient was brought to operating identifies correct patient.  She is placed upon the operating table general anesthesia was induced.  The ICG dye had been injected in the preoperative holding area.  Her abdomen was prepped and draped in usual sterile fashion.  We made a small vertical incision below the umbilicus.  This was carried down to the fascia which was then opened with a scalpel.  Hemostat was then used to pass to the peritoneal cavity under direct vision.  A 0 Vicryl pursestring suture was then placed around the fascial opening.  The [facility] port was placed through the opening and insufflation of the abdomen was begun.  A 5 mm trocar was then placed the patient epigastrium and 2 in the right upper quadrant all under direct vision.  The gallbladder was identified and found to be quite elongated and thick-walled.  It was retracted above the liver bed.  Several adhesions were then taken down both bluntly and with electrocautery.  The ICG dye easily identified the common duct and cystic duct.  We were able to achieve a critical window around the cystic duct.  It was then clipped multiple times proximally and distally and transected.  The cystic artery was identified and clipped proximally and distally and transected as well.  A posterior branch of the artery was also identified and transected after  clipping.  The gallbladder was then slowly dissected free from the liver bed with electrocautery.  Again, the ICG dye allowed easy delineation of the cystic duct and common duct.  Once the gallbladder was freed from the liver bed hemostasis was achieved with the cautery.  The gallbladder was then placed in an Endosac and removed to the incision at the umbilicus.  The 0 Vicryl the umbilicus then tied in place closing the fascial defect.  The right upper quadrant was then irrigated with saline.  Again hemostasis appeared to be achieved.  All ports were then removed under direct vision and the abdomen was deflated.  All incisions were anesthetized Marcaine and closed with 4-0 Monocryl sutures.  Dermabond was then applied.  The patient tolerated the procedure well.  All the counts were correct at the end of the procedure.  The patient was then extubated in the operating room and taken in stable condition to the recovery room.          Abigail Miyamoto   Date: 07/28/2021  Time: 8:49 AM

## 2021-07-28 NOTE — Anesthesia Procedure Notes (Signed)
Procedure Name: Intubation Date/Time: 07/28/2021 7:24 AM  Performed by: Ayman Brull, Forest Gleason, CRNAPre-anesthesia Checklist: Patient identified, Emergency Drugs available, Suction available, Patient being monitored and Timeout performed Patient Re-evaluated:Patient Re-evaluated prior to induction Oxygen Delivery Method: Circle system utilized Preoxygenation: Pre-oxygenation with 100% oxygen Induction Type: IV induction Ventilation: Mask ventilation without difficulty Laryngoscope Size: Mac and 3 Grade View: Grade I Tube type: Oral Tube size: 7.0 mm Number of attempts: 1 Airway Equipment and Method: Stylet Placement Confirmation: ETT inserted through vocal cords under direct vision, positive ETCO2, CO2 detector and breath sounds checked- equal and bilateral Secured at: 20 cm Tube secured with: Tape Dental Injury: Teeth and Oropharynx as per pre-operative assessment

## 2021-07-28 NOTE — Anesthesia Postprocedure Evaluation (Signed)
Anesthesia Post Note  Patient: Deanna Mccormick  Procedure(s) Performed: LAPAROSCOPIC CHOLECYSTECTOMY WITH ICG     Patient location during evaluation: PACU Anesthesia Type: General Level of consciousness: awake and alert Pain management: pain level controlled Vital Signs Assessment: post-procedure vital signs reviewed and stable Respiratory status: spontaneous breathing, nonlabored ventilation, respiratory function stable and patient connected to nasal cannula oxygen Cardiovascular status: blood pressure returned to baseline and stable Postop Assessment: no apparent nausea or vomiting Anesthetic complications: no   No notable events documented.  Last Vitals:  Vitals:   07/28/21 1000 07/28/21 1023  BP: 112/78 126/78  Pulse: 65 69  Resp: (!) 21 20  Temp:    SpO2: 98% 99%    Last Pain:  Vitals:   07/28/21 1023  TempSrc:   PainSc: 6                  [place] P Emmalea Treanor

## 2021-07-28 NOTE — Transfer of Care (Signed)
Immediate Anesthesia Transfer of Care Note  Patient: Deanna Mccormick  Procedure(s) Performed: LAPAROSCOPIC CHOLECYSTECTOMY WITH ICG  Patient Location: PACU  Anesthesia Type:General  Level of Consciousness: awake  Airway & Oxygen Therapy: Patient Spontanous Breathing  Post-op Assessment: Report given to RN  Post vital signs: stable  Last Vitals:  Vitals Value Taken Time  BP 121/83 07/28/21 0910  Temp    Pulse 80 07/28/21 0910  Resp 20 07/28/21 0910  SpO2 100 % 07/28/21 0910  Vitals shown include unvalidated device data.  Last Pain:  Vitals:   07/28/21 0611  TempSrc:   PainSc: 0-No pain         Complications: No notable events documented.

## 2021-07-28 NOTE — Discharge Instructions (Signed)
[facility] ______[facility] LAPAROSCOPIC SURGERY: POST OP INSTRUCTIONS Always review your discharge instruction sheet given to you by the facility where your surgery was performed. IF YOU HAVE DISABILITY OR FAMILY LEAVE FORMS, YOU MUST BRING THEM TO THE OFFICE FOR PROCESSING.   DO NOT GIVE THEM TO YOUR DOCTOR.  A prescription for pain medication may be given to you upon discharge.  Take your pain medication as prescribed, if needed.  If narcotic pain medicine is not needed, then you may take acetaminophen (Tylenol) or ibuprofen (Advil) as needed. Take your usually prescribed medications unless otherwise directed. If you need a refill on your pain medication, please contact your pharmacy.  They will contact our office to request authorization. Prescriptions will not be filled after 5pm or on week-ends. You should follow a light diet the first few days after arrival home, such as soup and crackers, etc.  Be sure to include lots of fluids daily. Most patients will experience some swelling and bruising in the area of the incisions.  Ice packs will help.  Swelling and bruising can take several days to resolve.  It is common to experience some constipation if taking pain medication after surgery.  Increasing fluid intake and taking a stool softener (such as Colace) will usually help or prevent this problem from occurring.  A mild laxative (Milk of Magnesia or Miralax) should be taken according to package instructions if there are no bowel movements after 48 hours. Unless discharge instructions indicate otherwise, you may remove your bandages 24-48 hours after surgery, and you may shower at that time.  You may have steri-strips (small skin tapes) in place directly over the incision.  These strips should be left on the skin for 7-10 days.  If your surgeon used skin glue on the incision, you may shower in 24 hours.  The glue will flake off over the next 2-3 weeks.  Any sutures or staples will be  removed at the office during your follow-up visit. ACTIVITIES:  You may resume regular (light) daily activities beginning the next day--such as daily self-care, walking, climbing stairs--gradually increasing activities as tolerated.  You may have sexual intercourse when it is comfortable.  Refrain from any heavy lifting or straining until approved by your doctor. You may drive when you are no longer taking prescription pain medication, you can comfortably wear a seatbelt, and you can safely maneuver your car and apply brakes. RETURN TO WORK:  __________________________________________________________ You should see your doctor in the office for a follow-up appointment approximately 2-3 weeks after your surgery.  Make sure that you call for this appointment within a day or two after you arrive home to insure a convenient appointment time. OTHER INSTRUCTIONS: OK TO SHOWER STARTING TOMORROW ICE PACK, TYLENOL, AND IBUPROFEN ALSO FOR PAIN NO LIFTING MORE THAN 15 TO 20 POUNDS FOR 2 WEEKS __________________________________________________________________________________________________________________________ __________________________________________________________________________________________________________________________ WHEN TO CALL YOUR DOCTOR: Fever over 101.0 Inability to urinate Continued bleeding from incision. Increased pain, redness, or drainage from the incision. Increasing abdominal pain  The clinic staff is available to answer your questions during regular business hours.  Please don't hesitate to call and ask to speak to one of the nurses for clinical concerns.  If you have a medical emergency, go to the nearest emergency room or call 911.  A surgeon from [facility] is always on call at the hospital. [address], [address], [place], [place]  [address] ? [address] [address], [place], [place]   [address] [phone] ?   [phone] ? FAX [phone] Web site:  [email or web address]  

## 2021-07-29 ENCOUNTER — Encounter (HOSPITAL_COMMUNITY): Payer: Self-pay | Admitting: Surgery

## 2021-07-29 LAB — SURGICAL PATHOLOGY

## 2021-08-04 NOTE — Progress Notes (Signed)
Addendum: Followed up with patient regarding a complaint: Thursday August 04, 2021 @ 1400  Pt C/O half of her eyebrow was gone at the end of case and no one told her. She understood that sometimes things do not go as planned, but was most upset that no one had told her about this. In addition she complained that the the PACU Nursing staff was rude to her. After listening to her concerns . I stated that I was sorry that this visit did not meet her expectations, and someone should have told her about her eyebrow. In addition, I would speak to her anesthesia team again about such a disclosure. Moreover. I will talk to PACU leadership about her experience, because rudeness is not acceptable.  Tera Mater. Richardson Landry, M.D.

## 2021-08-04 NOTE — Addendum Note (Signed)
Addendum  created 08/04/21 1417 by Trevor Iha, MD   Clinical Note Signed

## 2021-11-04 ENCOUNTER — Ambulatory Visit: Admit: 2021-11-04 | Payer: PRIVATE HEALTH INSURANCE | Primary: Family Medicine

## 2021-11-21 ENCOUNTER — Inpatient Hospital Stay: Admit: 2021-11-21 | Discharge: 2021-11-21 | Payer: PRIVATE HEALTH INSURANCE | Primary: Family Medicine

## 2021-11-21 DIAGNOSIS — N6325 Unspecified lump in the left breast, overlapping quadrants: Secondary | ICD-10-CM

## 2021-11-21 DIAGNOSIS — N6320 Unspecified lump in the left breast, unspecified quadrant: Secondary | ICD-10-CM

## 2022-01-31 ENCOUNTER — Other Ambulatory Visit (HOSPITAL_BASED_OUTPATIENT_CLINIC_OR_DEPARTMENT_OTHER): Payer: Self-pay

## 2022-03-28 ENCOUNTER — Inpatient Hospital Stay: Admit: 2022-03-28 | Discharge: 2022-03-28 | Payer: PRIVATE HEALTH INSURANCE | Primary: Family Medicine

## 2022-04-10 ENCOUNTER — Inpatient Hospital Stay: Admit: 2022-04-10 | Discharge: 2022-04-10 | Payer: PRIVATE HEALTH INSURANCE | Primary: Family Medicine

## 2022-05-23 ENCOUNTER — Ambulatory Visit: Admit: 2022-05-23 | Payer: PRIVATE HEALTH INSURANCE | Primary: Family Medicine

## 2022-05-25 ENCOUNTER — Encounter: Admit: 2022-05-25 | Payer: PRIVATE HEALTH INSURANCE | Primary: Family Medicine

## 2022-05-25 ENCOUNTER — Inpatient Hospital Stay: Admit: 2022-05-25 | Discharge: 2022-05-25 | Payer: PRIVATE HEALTH INSURANCE | Primary: Family Medicine

## 2022-05-25 DIAGNOSIS — N912 Amenorrhea, unspecified: Secondary | ICD-10-CM

## 2022-05-25 DIAGNOSIS — N6325 Unspecified lump in the left breast, overlapping quadrants: Secondary | ICD-10-CM

## 2022-05-25 DIAGNOSIS — A64 Unspecified sexually transmitted disease: Secondary | ICD-10-CM

## 2022-05-25 DIAGNOSIS — R7303 Prediabetes: Secondary | ICD-10-CM

## 2022-05-25 DIAGNOSIS — N6320 Unspecified lump in the left breast, unspecified quadrant: Secondary | ICD-10-CM

## 2022-05-25 DIAGNOSIS — E669 Obesity, unspecified: Secondary | ICD-10-CM

## 2022-05-25 DIAGNOSIS — N939 Abnormal uterine and vaginal bleeding, unspecified: Secondary | ICD-10-CM

## 2022-05-30 ENCOUNTER — Ambulatory Visit
Admit: 2022-05-30 | Payer: PRIVATE HEALTH INSURANCE | Attending: Vascular and Interventional Radiology | Primary: Family Medicine

## 2022-05-30 ENCOUNTER — Encounter
Admit: 2022-05-30 | Payer: PRIVATE HEALTH INSURANCE | Attending: Vascular and Interventional Radiology | Primary: Family Medicine

## 2022-05-30 DIAGNOSIS — N912 Amenorrhea, unspecified: Secondary | ICD-10-CM

## 2022-05-30 DIAGNOSIS — N939 Abnormal uterine and vaginal bleeding, unspecified: Secondary | ICD-10-CM

## 2022-05-30 DIAGNOSIS — H9313 Tinnitus, bilateral: Secondary | ICD-10-CM

## 2022-05-30 DIAGNOSIS — A64 Unspecified sexually transmitted disease: Secondary | ICD-10-CM

## 2022-05-30 DIAGNOSIS — R7303 Prediabetes: Secondary | ICD-10-CM

## 2022-05-30 DIAGNOSIS — H9190 Unspecified hearing loss, unspecified ear: Secondary | ICD-10-CM

## 2022-05-30 DIAGNOSIS — E669 Obesity, unspecified: Secondary | ICD-10-CM

## 2022-05-30 DIAGNOSIS — H9201 Otalgia, right ear: Secondary | ICD-10-CM

## 2022-05-30 NOTE — Progress Notes
It is my pleasure to see your patient Ms. Lablanc whose office note is provided below.HPI: Ms. Riggin is referred by Dr Rhea Bleacher, Candace Cruise for consultation regarding evaluation of right otalgia  Amy Paul is a 37 y.o. female, new patient to our office, presenting with right sided otalgia which is bothersome and daily. It is described as a throbbing sensation which lasts a few minutes and then subsides. She denies recurrent ear infections. She denies otorrhea, dizziness, or lightheaded associated with it. She has intermittent tinnitus. She has normal hearing, but feels that the right ear is not as clear as the left. She denies a family history of hearing loss. She denies recurrent ear infections. She occasionally will use Q tips. She has a history of migraines and headaches which has significantly improved. The patient?s complete system review, past medical, family, and social history have been reviewed and are as noted in the electronic record and below.Past Medical History: has a past medical history of Abnormal uterine bleeding, Amenorrhea, Obesity, Prediabetes, and STD (sexually transmitted disease).Past Surgical History: has a past surgical history that includes No past surgeries.Social History:  reports that she has never smoked. She has never used smokeless tobacco. She reports current alcohol use. She reports that she does not use drugs.Family History:Family History Problem Relation Age of Onset  No Known Problems Mother   Lymphoma Father   Uterine cancer Paternal Aunt 56  Diabetes Maternal Grandmother   Heart attack Neg Hx   Stroke Neg Hx   Thrombophilia Neg Hx   Medication:Outpatient Encounter Medications as of 05/30/2022 Medication Sig Dispense Refill  metroNIDAZOLE (METROGEL VAGINAL) 0.75 % (37.5mg /5 gram) vaginal gel Place 1 Applicatorful vaginally nightly for 5 days. 70 g 2  norgestimate-ethinyl estradioL (ORTHO TRI-CYCLEN LO) 0.18/0.215/0.25 mg-25 mcg tablet Take 1 tablet by mouth daily. (Patient not taking: Reported on 05/30/2022) 84 tablet 4 No facility-administered encounter medications on file as of 05/30/2022.  Allergies:No Known Allergies PHYSICAL EXAM: Constitutional:  The patient is well-developed and well-nourished in no acute distress.General: Normal voiceHead and Face:  Normocephalic, atraumatic without evidence of lesions. Face symmetric, no maxillary/frontal sinus tenderness upon palpationEyes:  Extraocular movements were intact. Conjunctivae are normal. Pupils are equal and round. Ears: AD: The external ear is well-developed. The external auditory canal is patent. The tympanic membrane is intact without evidence of perforation or effusion. AS: The external ear is well-developed. The external auditory canal is patent. The tympanic membrane is intact without evidence of perforation or effusionTuning Forks: Weber midline.  Rinne tuning fork test normal (AC> BC).  Finger rub test is bilaterally normal. Nose: The external nose appears normal.  Septum is midline. Turbinates are 2+. OC/Oropharynx: Good dentition. Moist oral mucosa.  The floor of mouth and oral tongue are soft. The tongue is fully mobile. Uvula is midline. Pharynx/Larynx:  Pharynx normal, no lesions notedNeck: The neck is soft and supple. There is no tenderness over the thyrohyoid area. There are no thyroid masses. There are no parotid masses and no submandibular salivary gland masses. No lymphadenopathyNeurologic: Cranial nerves II-XII were grossly intact bilaterallyPsychiatric: Affect normalRespiratory: Symmetric chest expansion, no stridor, no retractions.CV: RRRBalance Tests:  Smooth pursuit and saccades normal. Finger to nose is normal. Romberg is normal. Baldwin Crown was performed and revealed no turning tendency.  Cerebellar testing revealed no ataxia. Patient has a normal gait. Head Thrust is Normal.  Dix-Hallpike is negative bilaterally.  Head Shake did not reveal any nystagmus after head shake.		Diagnosis:1. Subjective hearing loss  Ambulatory referral to Audiology  2.  Acute otalgia, right  Ambulatory referral to Audiology  3. Tinnitus of both ears      Assessment/Plan:Right otalgiaReassurance provided that there is no evidence of fluid or infection. I suspect that this is related to referred pain secondary to musculoskeletal issues. We discussed management of symptoms with warm compresses and use of antiinflammatory medications. If worsens we can consider craniosacral therapy . Will plan for audiogram since she has perceived different between the right and left earsThe patient seemed to be in agreement and will contact me for persistent or progressive symptoms or return in follow-up.30  minute encounterElectronically Signed by Magdalene Patricia, PA

## 2022-05-30 NOTE — Progress Notes
The following is the transcribed Review of Systems completed by the patient at intake.Review of Systems HENT:        Pt believes there is a blockage in the ear All other systems reviewed and are negative.

## 2022-12-06 ENCOUNTER — Ambulatory Visit: Admit: 2022-12-06 | Payer: PRIVATE HEALTH INSURANCE | Primary: Family Medicine

## 2023-03-12 IMAGING — US US ABDOMEN LIMITED
1 series · 14 of 25 positions shown · non-contrast
Comparison: None Available.

CLINICAL DATA: Epigastric pain.

EXAM:
ULTRASOUND ABDOMEN LIMITED RIGHT UPPER QUADRANT

[Series 1: us abdomen limited · 14 of 66 slices shown]
[im 1/66]
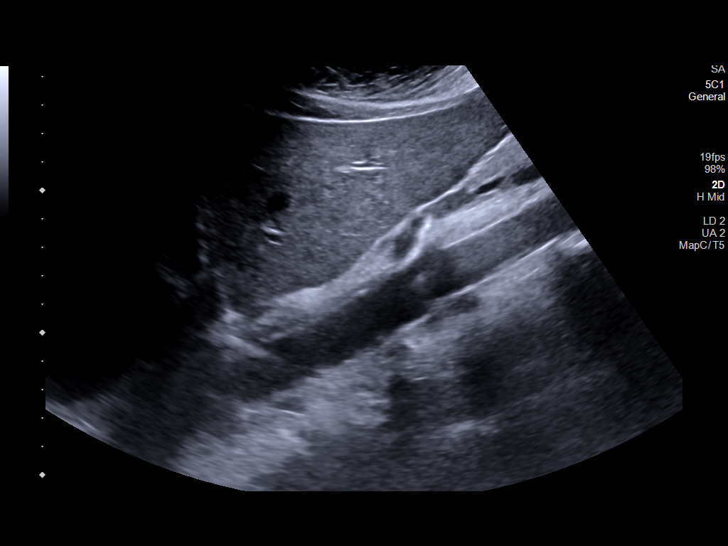
[im 6/66]
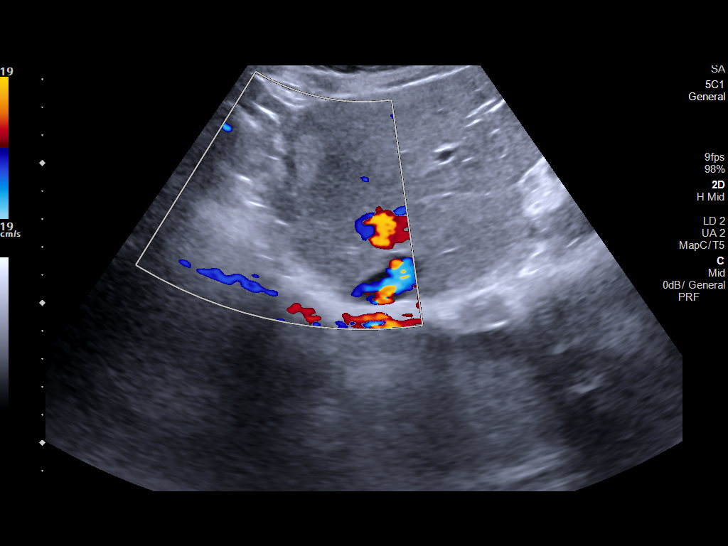
[im 11/66]
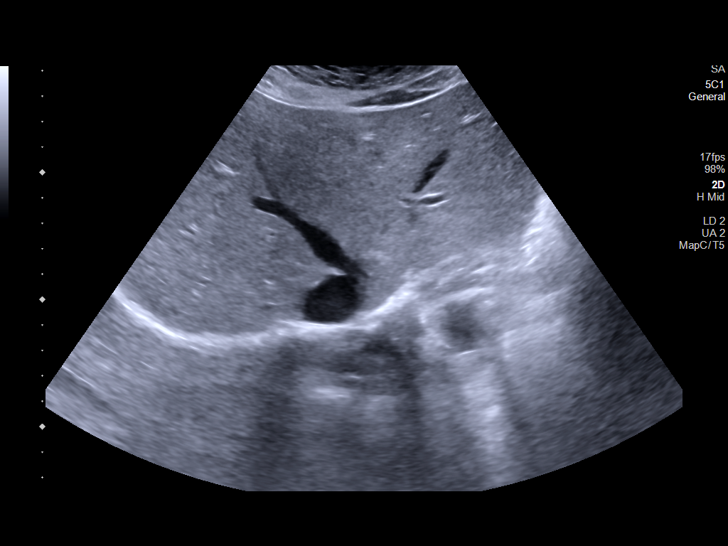
[im 17/66]
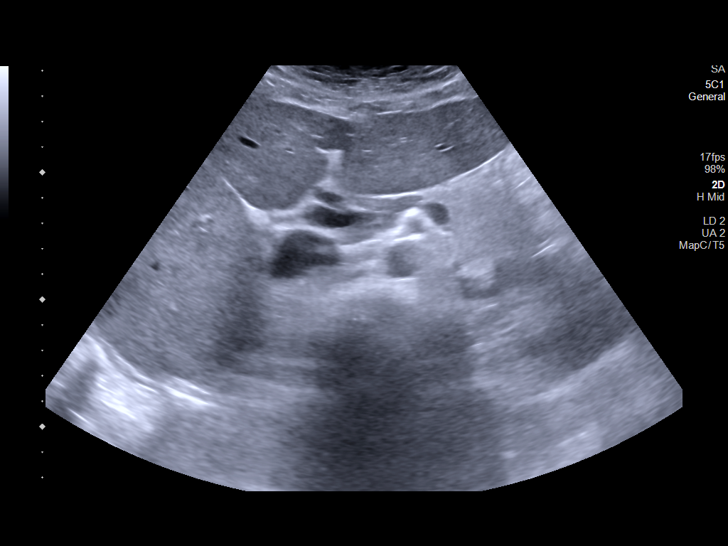
[im 22/66]
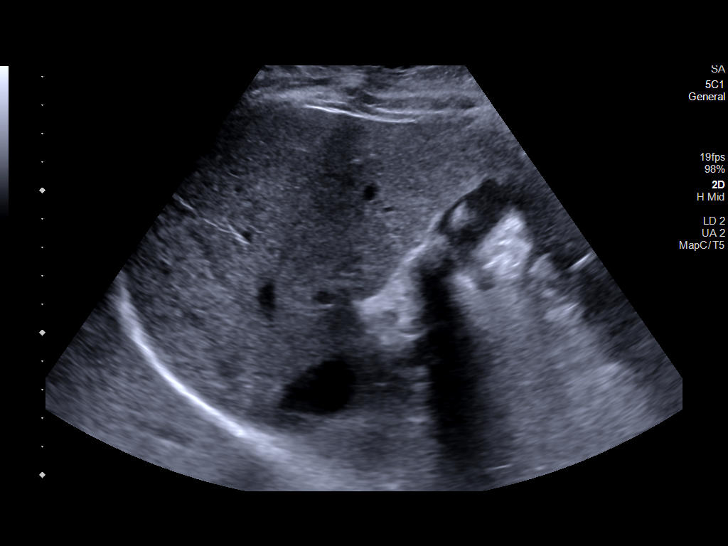
[im 25/66]
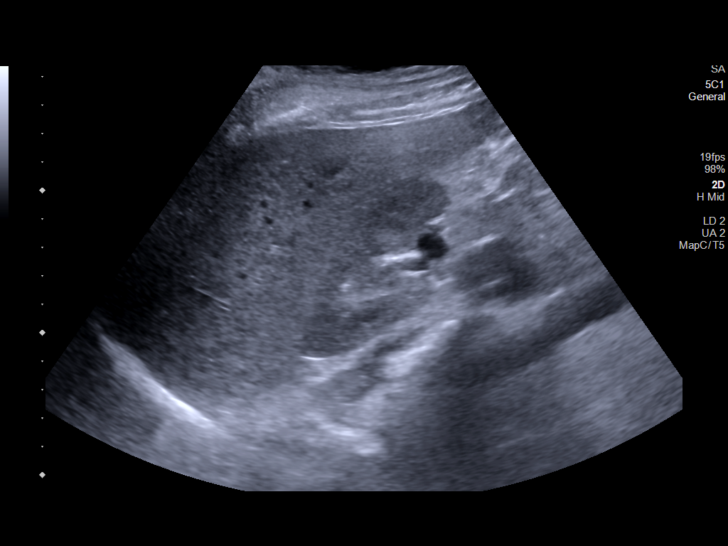
[im 30/66]
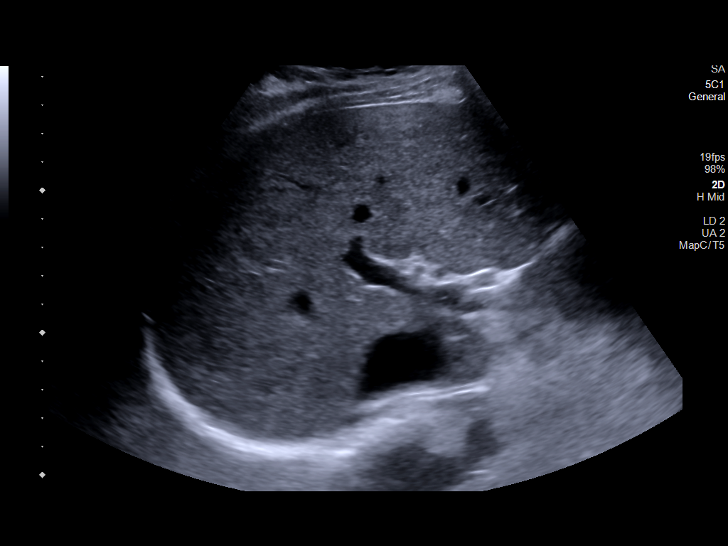
[im 36/66]
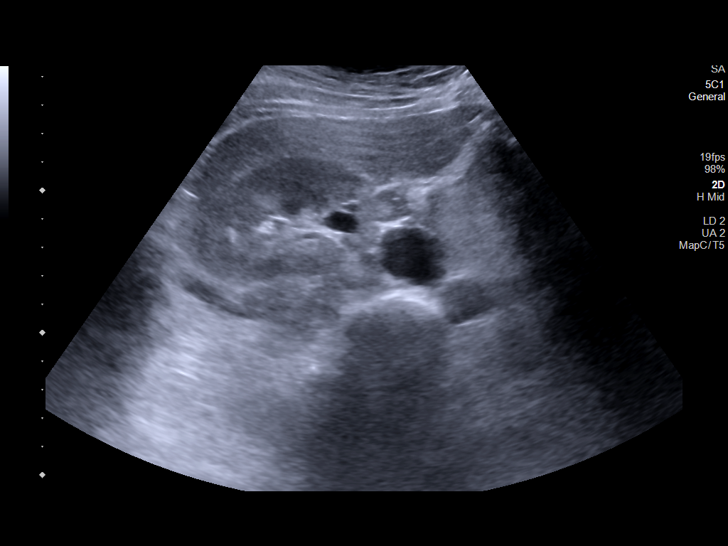
[im 41/66]
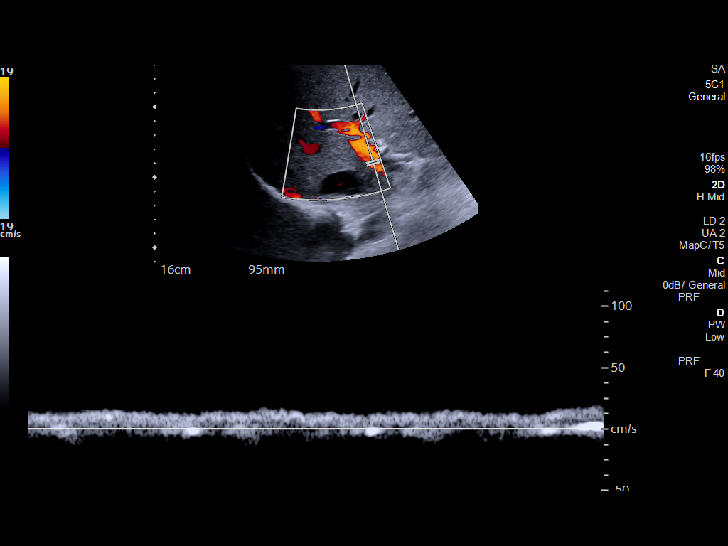
[im 44/66]
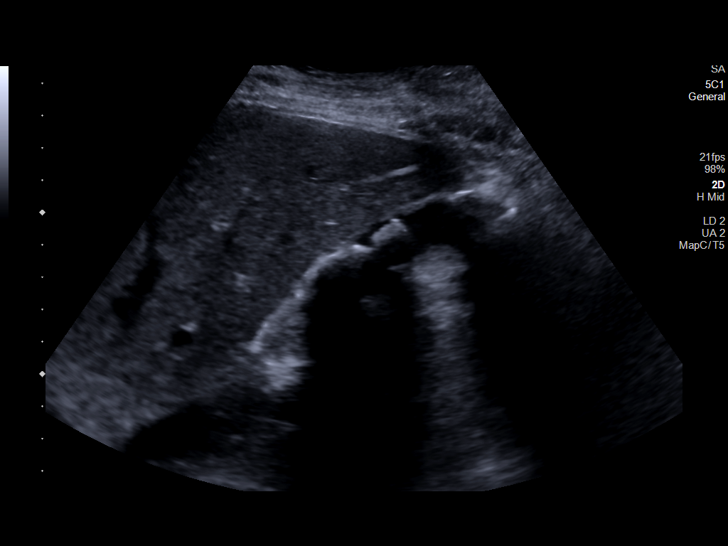
[im 49/66]
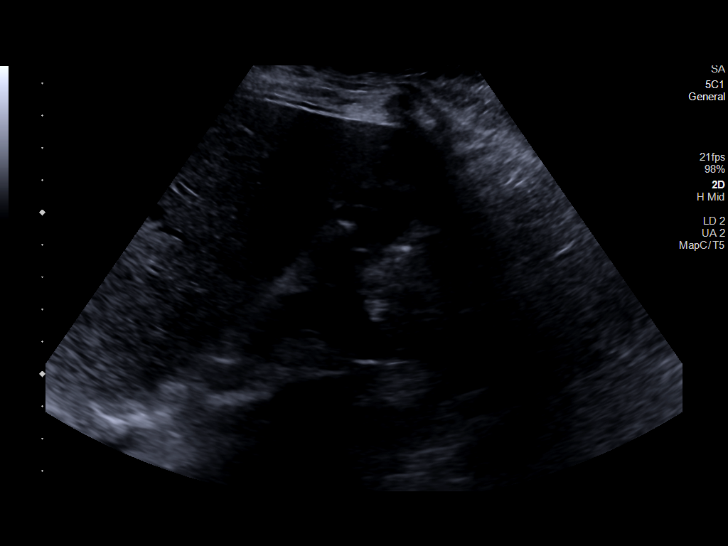
[im 55/66]
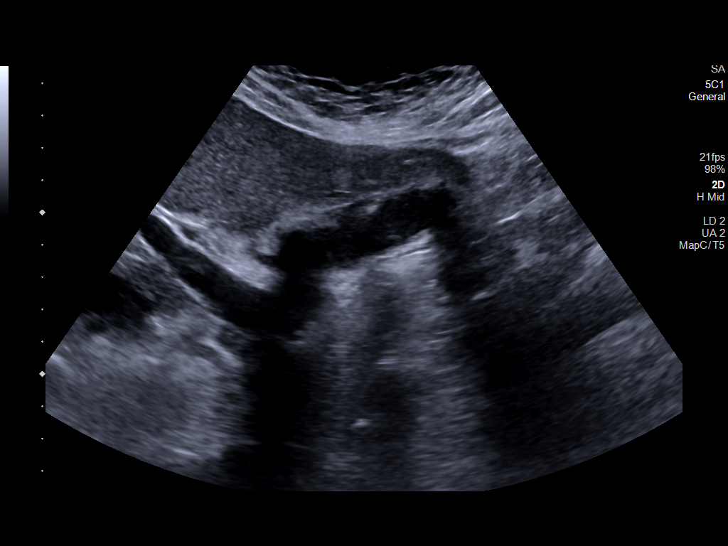
[im 60/66]
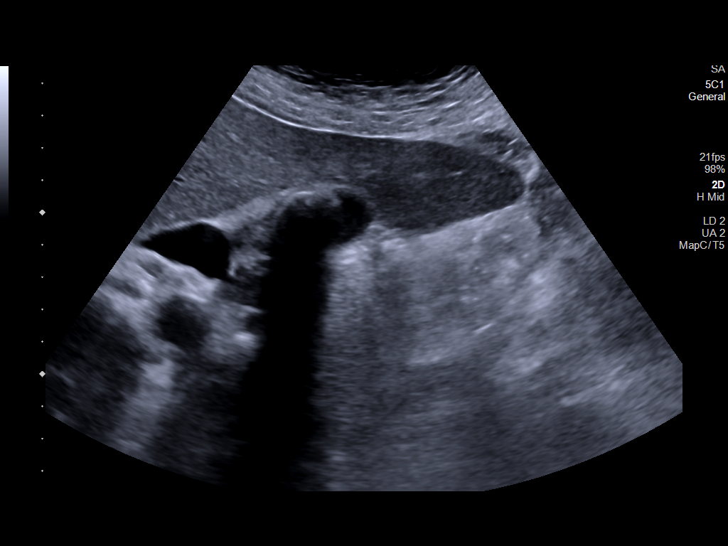
[im 66/66]
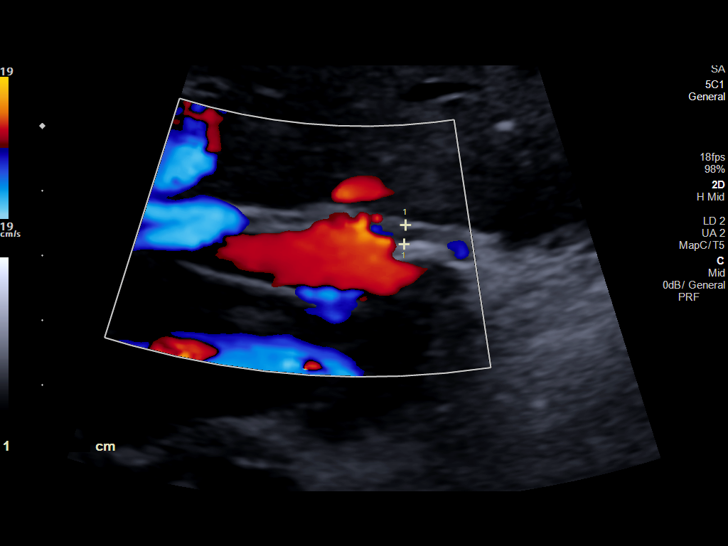

[14 of 25 positions shown; findings below may reference images not displayed]

FINDINGS: Gallbladder:

Multiple gallstones are present measuring up to 2.3 cm. Gallbladder
sludge is present. There is no gallbladder wall thickening. No
sonographic Murphy sign noted by sonographer.

Common bile duct:

Diameter: 3 mm.

Liver:

There is a 1.8 x 1.3 x 1.3 cm homogeneous echogenic lesion in the
left lobe of the liver. Within normal limits in parenchymal
echogenicity. Portal vein is patent on color Doppler imaging with
normal direction of blood flow towards the liver.

Other: None.
IMPRESSION: 1. Cholelithiasis with gallbladder sludge. No additional sonographic
evidence for acute cholecystitis.
2. 1.8 cm homogeneous echogenic lesion in the left lobe of the
liver. This is incompletely characterized by ultrasound, but may
represent a hemangioma. This can be definitively characterized with
MRI if clinically warranted.

## 2023-07-25 ENCOUNTER — Ambulatory Visit: Admit: 2023-07-25 | Payer: PRIVATE HEALTH INSURANCE | Primary: Family Medicine

## 2023-08-03 ENCOUNTER — Encounter: Admit: 2023-08-03 | Payer: PRIVATE HEALTH INSURANCE | Primary: Family Medicine

## 2023-08-07 ENCOUNTER — Encounter: Admit: 2023-08-07 | Payer: PRIVATE HEALTH INSURANCE | Attending: Maternal & Fetal Medicine | Primary: Family Medicine

## 2023-08-07 ENCOUNTER — Inpatient Hospital Stay: Admit: 2023-08-07 | Discharge: 2023-08-07 | Payer: PRIVATE HEALTH INSURANCE | Primary: Family Medicine

## 2023-08-07 DIAGNOSIS — O3680X0 Pregnancy with inconclusive fetal viability, not applicable or unspecified: Secondary | ICD-10-CM

## 2023-08-07 DIAGNOSIS — O0990 Supervision of high risk pregnancy, unspecified, unspecified trimester: Principal | ICD-10-CM

## 2023-08-07 DIAGNOSIS — O359XX0 Maternal care for (suspected) fetal abnormality and damage, unspecified, not applicable or unspecified: Secondary | ICD-10-CM

## 2023-08-07 DIAGNOSIS — Z3490 Encounter for supervision of normal pregnancy, unspecified, unspecified trimester: Principal | ICD-10-CM

## 2023-08-16 ENCOUNTER — Encounter: Admit: 2023-08-16 | Payer: PRIVATE HEALTH INSURANCE | Primary: Family Medicine

## 2023-08-16 NOTE — Other
Normal NT

## 2023-10-05 ENCOUNTER — Inpatient Hospital Stay: Admit: 2023-10-05 | Discharge: 2023-10-05 | Payer: PRIVATE HEALTH INSURANCE | Primary: Family Medicine

## 2023-10-05 ENCOUNTER — Encounter: Admit: 2023-10-05 | Payer: PRIVATE HEALTH INSURANCE | Attending: Maternal & Fetal Medicine | Primary: Family Medicine

## 2023-10-05 DIAGNOSIS — Z3689 Encounter for other specified antenatal screening: Secondary | ICD-10-CM

## 2023-10-05 DIAGNOSIS — O09522 Supervision of elderly multigravida, second trimester: Principal | ICD-10-CM

## 2023-10-05 DIAGNOSIS — Z148 Genetic carrier of other disease: Secondary | ICD-10-CM

## 2023-10-05 DIAGNOSIS — O0990 Supervision of high risk pregnancy, unspecified, unspecified trimester: Principal | ICD-10-CM

## 2023-10-11 ENCOUNTER — Encounter: Admit: 2023-10-11 | Payer: PRIVATE HEALTH INSURANCE | Primary: Family Medicine

## 2023-10-11 DIAGNOSIS — O0990 Supervision of high risk pregnancy, unspecified, unspecified trimester: Secondary | ICD-10-CM

## 2023-11-27 ENCOUNTER — Encounter: Admit: 2023-11-27 | Payer: PRIVATE HEALTH INSURANCE | Primary: Family Medicine

## 2023-11-27 ENCOUNTER — Inpatient Hospital Stay: Admit: 2023-11-27 | Discharge: 2023-11-27 | Payer: PRIVATE HEALTH INSURANCE | Primary: Family Medicine

## 2023-11-27 DIAGNOSIS — O0990 Supervision of high risk pregnancy, unspecified, unspecified trimester: Principal | ICD-10-CM

## 2023-11-27 DIAGNOSIS — O99213 Obesity complicating pregnancy, third trimester: Secondary | ICD-10-CM

## 2023-11-27 DIAGNOSIS — O09523 Supervision of elderly multigravida, third trimester: Secondary | ICD-10-CM

## 2023-11-27 DIAGNOSIS — Z3A28 28 weeks gestation of pregnancy: Secondary | ICD-10-CM

## 2023-11-28 ENCOUNTER — Encounter: Admit: 2023-11-28 | Payer: PRIVATE HEALTH INSURANCE | Primary: Family Medicine

## 2023-11-28 DIAGNOSIS — O0990 Supervision of high risk pregnancy, unspecified, unspecified trimester: Principal | ICD-10-CM

## 2023-12-19 ENCOUNTER — Inpatient Hospital Stay: Admit: 2023-12-19 | Discharge: 2023-12-19 | Payer: PRIVATE HEALTH INSURANCE | Primary: Family Medicine

## 2023-12-19 ENCOUNTER — Encounter: Admit: 2023-12-19 | Payer: PRIVATE HEALTH INSURANCE | Primary: Family Medicine

## 2023-12-19 DIAGNOSIS — O0990 Supervision of high risk pregnancy, unspecified, unspecified trimester: Principal | ICD-10-CM

## 2023-12-24 ENCOUNTER — Encounter: Admit: 2023-12-24 | Payer: PRIVATE HEALTH INSURANCE | Primary: Family Medicine

## 2023-12-24 DIAGNOSIS — O0990 Supervision of high risk pregnancy, unspecified, unspecified trimester: Principal | ICD-10-CM

## 2024-01-18 ENCOUNTER — Inpatient Hospital Stay: Admit: 2024-01-18 | Discharge: 2024-01-18 | Payer: PRIVATE HEALTH INSURANCE | Primary: Family Medicine

## 2024-01-18 ENCOUNTER — Encounter: Admit: 2024-01-18 | Payer: PRIVATE HEALTH INSURANCE | Primary: Family Medicine

## 2024-01-18 DIAGNOSIS — O0990 Supervision of high risk pregnancy, unspecified, unspecified trimester: Principal | ICD-10-CM

## 2024-01-18 DIAGNOSIS — Z3A35 35 weeks gestation of pregnancy: Secondary | ICD-10-CM

## 2024-01-18 DIAGNOSIS — O99213 Obesity complicating pregnancy, third trimester: Secondary | ICD-10-CM

## 2024-01-18 DIAGNOSIS — O09523 Supervision of elderly multigravida, third trimester: Secondary | ICD-10-CM

## 2024-01-18 DIAGNOSIS — O99210 Obesity complicating pregnancy, unspecified trimester: Secondary | ICD-10-CM

## 2024-01-25 ENCOUNTER — Inpatient Hospital Stay: Admit: 2024-01-25 | Discharge: 2024-01-25 | Payer: Managed Care (Private) | Primary: Family Medicine

## 2024-02-25 ENCOUNTER — Inpatient Hospital Stay
Admission: RE | Admit: 2024-02-25 | Discharge: 2024-02-28 | Payer: PRIVATE HEALTH INSURANCE | Admitting: Obstetrics and Gynecology | Primary: Family Medicine

## 2024-02-25 ENCOUNTER — Encounter: Admit: 2024-02-25 | Payer: PRIVATE HEALTH INSURANCE | Attending: Anesthesiology | Primary: Family Medicine

## 2024-02-25 LAB — CBC WITHOUT DIFFERENTIAL
BKR WAM ANC (ABSOLUTE NEUTROPHIL COUNT): 5.88 x 1000/ÂµL (ref 2.00–7.60)
BKR WAM HEMATOCRIT: 35.2 % (ref 35.00–45.00)
BKR WAM HEMOGLOBIN: 12.1 g/dL (ref 11.7–15.5)
BKR WAM MCH: 28.9 pg (ref 27.0–33.0)
BKR WAM MCHC: 34.4 g/dL (ref 31.0–36.0)
BKR WAM MCV: 84.2 fL (ref 80.0–100.0)
BKR WAM MPV: 9.6 fL (ref 8.0–12.0)
BKR WAM PLATELETS: 289 10*3/uL (ref 150–420)
BKR WAM RDW-CV: 14.5 % (ref 11.0–15.0)
BKR WAM RED BLOOD CELL COUNT.: 4.18 M/ÂµL (ref 4.00–6.00)
BKR WAM WHITE BLOOD CELL COUNT: 7.8 10*3/uL (ref 4.0–11.0)

## 2024-02-25 LAB — TREPONEMA PALLIDUM (SYPHILIS) ANTIBODY W/REFLEX
BKR TREPONEMA PALLIDUM ANTIBODY INITIAL RESULT: <0.1 {index}
BKR TREPONEMA PALLIDUM ANTIBODY TOTAL, SERUM: NONREACTIVE

## 2024-02-25 MED ORDER — TERBUTALINE 1 MG/ML SUBCUTANEOUS SOLUTION
1 | Freq: Once | SUBCUTANEOUS | Status: CP
Start: 2024-02-25 — End: ?
  Administered 2024-02-25: 20:00:00 1 mL via SUBCUTANEOUS

## 2024-02-25 MED ORDER — ACETAMINOPHEN 325 MG TABLET
325 | ORAL | Status: DC | PRN
Start: 2024-02-25 — End: 2024-02-27

## 2024-02-25 MED ORDER — DIPHENHYDRAMINE 50 MG/ML INJECTION (WRAPPED E-RX)
50 | Freq: Once | INTRAVENOUS | Status: DC
Start: 2024-02-25 — End: 2024-02-26

## 2024-02-25 MED ORDER — FLU VACCINE TS 2025-26(6MOS UP)(PF) 45 MCG(15MCG X3)/0.5 ML IM SYRINGE
45 | INTRAMUSCULAR | Status: DC
Start: 2024-02-25 — End: 2024-02-26

## 2024-02-25 MED ORDER — SODIUM CHLORIDE 0.9 % (FLUSH) INJECTION SYRINGE
0.9 | INTRAVENOUS | Status: DC | PRN
Start: 2024-02-25 — End: 2024-02-27

## 2024-02-25 MED ORDER — DIPHENHYDRAMINE 50 MG/ML INJECTION (WRAPPED E-RX)
50 | Freq: Once | INTRAMUSCULAR | Status: DC
Start: 2024-02-25 — End: 2024-02-26

## 2024-02-25 MED ORDER — CHLORHEXIDINE GLUCONATE 2 % TOWELETTE
2 | TOPICAL | Status: DC | PRN
Start: 2024-02-25 — End: 2024-02-27

## 2024-02-25 MED ORDER — NALBUPHINE 20 MG/ML INJECTION SOLUTION
20 | Freq: Once | INTRAVENOUS | Status: DC
Start: 2024-02-25 — End: 2024-02-26

## 2024-02-25 MED ORDER — SODIUM CHLORIDE 0.9 % (FLUSH) INJECTION SYRINGE
0.9 | Freq: Three times a day (TID) | INTRAVENOUS | Status: DC
Start: 2024-02-25 — End: 2024-02-26
  Administered 2024-02-25 (×2): 0.9 mL via INTRAVENOUS

## 2024-02-25 MED ORDER — PRENATAL VIT,CALCIUM 27-FERROUS FUM 60 MG IRON-FOLIC ACID 1 MG TABLET
60 | Freq: Every day | ORAL | Status: DC
Start: 2024-02-25 — End: 2024-02-26
  Administered 2024-02-25: 14:00:00 60 mg iron-1 mg via ORAL

## 2024-02-25 MED ORDER — MISOPROSTOL (CYTOTEC) 25 MCG QUARTERTAB
25 | ORAL | Status: DC
Start: 2024-02-25 — End: 2024-02-26
  Administered 2024-02-25 (×4): 25 ug via ORAL

## 2024-02-25 MED ORDER — NALBUPHINE 20 MG/ML INJECTION SOLUTION
20 | Freq: Once | INTRAMUSCULAR | Status: DC
Start: 2024-02-25 — End: 2024-02-26

## 2024-02-25 NOTE — Evaluation [3041234]
 Brief Note39 y.o. [id number] @ [redacted]w[redacted]d ( date 2/2 ), EDD 02/18/2024, by Last Menstrual Period presenting for eIOL. Pregnancy c/b  obesity (BMI 63), rare hx of elevated BP w/o formal dx, SMA carrier.To bedside 2/2 to AFTOn arrival, fetus noted to be to 90sRN palpating abdomen which is firm, and on monitor back to back contractions presentPatient without other complaintsVitals:  02/24/24 2049 02/25/24 0446 02/25/24 0928 BP: 135/74 129/73 122/72 Pulse: (!) 101 (!) 100 81 Resp: 18 19 18  Temp: 97.8 ?F (36.6 ?C) 97.7 ?F (36.5 ?C) 98 ?F (36.7 ?C) TempSrc: Temporal Temporal Temporal SpO2: 98% 97% 97% Weight: (!) 161 kg   Height: 5' 2.5 (1.588 m)   BMI Readings from Last 3 Encounters: 02/24/24 63.90 kg/m? 02/22/24 63.90 kg/m? 02/15/24 62.82 kg/m?  FWB: 135/mod/+/5-6 min prolonged, rare variable3 in 10 on averagePhysical ExamGen: NADLungs: NWOB on RAAbd: Gravid, firm, obeseGU: Per Dr. Randall Nordmann to bedside and performed cervical exam, found to be 1/80/H. Discussion on balloon for additional ripening pending FWB.Arletha Sicks, M.D., M.S.Obstetrics, Gynecology & Reproductive Sciences, PGY-3[facility] on Epic Secure chat; emergent [phone]

## 2024-02-26 ENCOUNTER — Encounter
Admit: 2024-02-26 | Payer: PRIVATE HEALTH INSURANCE | Attending: Student in an Organized Health Care Education/Training Program | Primary: Family Medicine

## 2024-02-26 ENCOUNTER — Inpatient Hospital Stay
Admit: 2024-02-26 | Payer: Managed Care (Private) | Attending: Student in an Organized Health Care Education/Training Program

## 2024-02-26 ENCOUNTER — Encounter: Admit: 2024-02-26 | Payer: PRIVATE HEALTH INSURANCE | Attending: Obstetrics and Gynecology | Primary: Family Medicine

## 2024-02-26 DIAGNOSIS — N912 Amenorrhea, unspecified: Principal | ICD-10-CM

## 2024-02-26 DIAGNOSIS — A64 Unspecified sexually transmitted disease: Secondary | ICD-10-CM

## 2024-02-26 DIAGNOSIS — R7303 Prediabetes: Secondary | ICD-10-CM

## 2024-02-26 DIAGNOSIS — E669 Obesity, unspecified: Secondary | ICD-10-CM

## 2024-02-26 DIAGNOSIS — N939 Abnormal uterine and vaginal bleeding, unspecified: Secondary | ICD-10-CM

## 2024-02-26 DIAGNOSIS — Z789 Other specified health status: Secondary | ICD-10-CM

## 2024-02-26 MED ORDER — NALBUPHINE 20 MG/ML INJECTION SOLUTION
20 | INTRAVENOUS | Status: DC | PRN
Start: 2024-02-26 — End: 2024-02-26
  Administered 2024-02-26: 05:00:00 20 mL via INTRAVENOUS

## 2024-02-26 MED ORDER — FENTANYL (PF) 2 MCG/ML-BUPIVACAINE 0.0625 %-NACL INJECTION SOLUTION
2 | Status: CP
Start: 2024-02-26 — End: ?

## 2024-02-26 MED ORDER — NALOXONE 0.4 MG/ML INJECTION SOLUTION
0.4 | INTRAVENOUS | Status: DC | PRN
Start: 2024-02-26 — End: 2024-02-27

## 2024-02-26 MED ORDER — DIPHENHYDRAMINE 50 MG/ML INJECTION (WRAPPED E-RX)
50 | INTRAVENOUS | Status: DC | PRN
Start: 2024-02-26 — End: 2024-02-27

## 2024-02-26 MED ORDER — DIPHENHYDRAMINE 50 MG/ML INJECTION (WRAPPED E-RX)
50 | Freq: Once | INTRAVENOUS | Status: CP
Start: 2024-02-26 — End: ?
  Administered 2024-02-26: 21:00:00 50 mL via INTRAVENOUS

## 2024-02-26 MED ORDER — CEFAZOLIN 1 GRAM SOLUTION FOR INJECTION
1 | INTRAVENOUS | Status: DC | PRN
Start: 2024-02-26 — End: 2024-02-27
  Administered 2024-02-26: 1 g via INTRAVENOUS

## 2024-02-26 MED ORDER — NALBUPHINE 20 MG/ML INJECTION SOLUTION
20 | INTRAMUSCULAR | Status: DC | PRN
Start: 2024-02-26 — End: 2024-02-26
  Administered 2024-02-26: 05:00:00 20 mL via INTRAMUSCULAR

## 2024-02-26 MED ORDER — ACETAMINOPHEN 325 MG TABLET
325 | Freq: Once | ORAL | Status: CP
Start: 2024-02-26 — End: ?
  Administered 2024-02-26: 23:00:00 325 mg via ORAL

## 2024-02-26 MED ORDER — DIPHENHYDRAMINE 50 MG/ML INJECTION (WRAPPED E-RX)
50 | Freq: Four times a day (QID) | INTRAMUSCULAR | Status: DC | PRN
Start: 2024-02-26 — End: 2024-02-26
  Administered 2024-02-26: 05:00:00 50 mL via INTRAMUSCULAR

## 2024-02-26 MED ORDER — SODIUM BICARBONATE 1 MEQ/ML (8.4 %) INTRAVENOUS SOLUTION
8.4 | INTRAVENOUS | Status: DC | PRN
Start: 2024-02-26 — End: 2024-02-27
  Administered 2024-02-26 (×3): 8.4 % via INTRAVENOUS

## 2024-02-26 MED ORDER — LACTATED RINGERS INTRAVENOUS SOLUTION
INTRAVENOUS | Status: DC
Start: 2024-02-26 — End: 2024-02-27
  Administered 2024-02-26 (×3): 1000.000 mL/h via INTRAVENOUS

## 2024-02-26 MED ORDER — OXYTOCIN 10 UNIT/ML INJECTION SOLUTION
10 | Freq: Once | INTRAMUSCULAR | Status: DC
Start: 2024-02-26 — End: 2024-02-27

## 2024-02-26 MED ORDER — FENTANYL (PF) 50 MCG/ML (WRAPPED ERX) INJECTION
50 | Status: CP
Start: 2024-02-26 — End: ?

## 2024-02-26 MED ORDER — NALBUPHINE 20 MG/ML INJECTION SOLUTION
20 | INTRAMUSCULAR | Status: DC | PRN
Start: 2024-02-26 — End: 2024-02-27

## 2024-02-26 MED ORDER — LIDOCAINE-EPINEPHRINE (PF) 1.5 %-1:200,000 INJECTION SOLUTION
1.5 | EPIDURAL | Status: DC | PRN
Start: 2024-02-26 — End: 2024-02-27
  Administered 2024-02-26 (×2): 1.5 %-1:200,000 via EPIDURAL

## 2024-02-26 MED ORDER — OXYTOCIN 30 UNIT/500 ML IN 0.9 % SODIUM CHLORIDE INTRAVENOUS
30 | INTRAVENOUS | Status: DC
Start: 2024-02-26 — End: 2024-02-27
  Administered 2024-02-26: 02:00:00 30 mL/h via INTRAVENOUS

## 2024-02-26 MED ORDER — FENTANYL (PF) 2 MCG/ML-BUPIVACAINE 0.0625 %-NACL INJECTION SOLUTION
2 | EPIDURAL | Status: DC | PRN
Start: 2024-02-26 — End: 2024-02-27
  Administered 2024-02-26: 10:00:00 2 mL/h via EPIDURAL

## 2024-02-26 MED ORDER — CHLOROPROCAINE (PF) 30 MG/ML (3 %) INJECTION SOLUTION
30 | EPIDURAL | Status: DC | PRN
Start: 2024-02-26 — End: 2024-02-27
  Administered 2024-02-26 – 2024-02-27 (×3): 30 mg/mL (3 %) via EPIDURAL

## 2024-02-26 MED ORDER — NALBUPHINE 20 MG/ML INJECTION SOLUTION
20 | INTRAVENOUS | Status: DC | PRN
Start: 2024-02-26 — End: 2024-02-27

## 2024-02-26 MED ORDER — LIDOCAINE (PF) 10 MG/ML (1 %) INJECTION SOLUTION
10 | EPIDURAL | Status: DC | PRN
Start: 2024-02-26 — End: 2024-02-27
  Administered 2024-02-26: 10:00:00 10 mg/mL (1 %) via EPIDURAL

## 2024-02-26 MED ORDER — FENTANYL (PF) 50 MCG/ML (WRAPPED ERX) INJECTION
50 | EPIDURAL | Status: DC | PRN
Start: 2024-02-26 — End: 2024-02-27
  Administered 2024-02-26: 50 ug/mL via EPIDURAL

## 2024-02-26 MED ORDER — AMPICILLIN 2GM MBP
Freq: Four times a day (QID) | INTRAVENOUS | Status: DC
Start: 2024-02-26 — End: 2024-02-27
  Administered 2024-02-26: 23:00:00 100.000 mL/h via INTRAVENOUS

## 2024-02-26 MED ORDER — BUPIVACAINE FOR ANESTHESIA
EPIDURAL | Status: DC | PRN
Start: 2024-02-26 — End: 2024-02-27
  Administered 2024-02-26: 10:00:00 10.000 mL via EPIDURAL

## 2024-02-26 MED ORDER — PHENYLEPHRINE FOR ANESTHESIA
INTRAVENOUS | Status: DC | PRN
Start: 2024-02-26 — End: 2024-02-27
  Administered 2024-02-26: 250.000 mL/h via INTRAVENOUS

## 2024-02-26 MED ORDER — LACTATED RINGERS IV BOLUS NEW BAG (DROPS CHARGE)
Freq: Once | INTRAVENOUS | Status: CP
Start: 2024-02-26 — End: ?
  Administered 2024-02-26: 14:00:00 1000.000 mL/h via INTRAVENOUS

## 2024-02-26 MED ORDER — DIPHENHYDRAMINE 50 MG/ML INJECTION (WRAPPED E-RX)
50 | INTRAMUSCULAR | Status: DC | PRN
Start: 2024-02-26 — End: 2024-02-27

## 2024-02-26 MED ORDER — LACTATED RINGERS IV BOLUS NEW BAG (DROPS CHARGE)
Freq: Once | INTRAVENOUS | Status: CP
Start: 2024-02-26 — End: ?
  Administered 2024-02-26: 10:00:00 1000.000 mL/h via INTRAVENOUS

## 2024-02-26 MED ORDER — LIDOCAINE-EPINEPHRINE (PF) 2 %-1:200,000 INJECTION SOLUTION
2 | INTRAVENOUS | Status: DC | PRN
Start: 2024-02-26 — End: 2024-02-27
  Administered 2024-02-26 (×4): 2 %-1:00,000 via INTRAVENOUS

## 2024-02-26 MED ORDER — DIPHENHYDRAMINE 50 MG/ML INJECTION (WRAPPED E-RX)
50 | Freq: Four times a day (QID) | INTRAVENOUS | Status: DC | PRN
Start: 2024-02-26 — End: 2024-02-26
  Administered 2024-02-26: 05:00:00 50 mL via INTRAVENOUS

## 2024-02-26 NOTE — Anesthesia Procedure Notes [28]
 Room and Bed: 472 , 472-A  Current Location: 472-A Neuraxial Block:Date/Time: 02/26/2024 5:54 AM epidural and intrathecalPain Diagnosis/Location: Labor Pain/Planned Vaginal Delivery       Requesting Physician: Sheilda Poe, MDPre-procedure Checklistpatient identified; pre-op evaluation performed; Universal Protocol/timeout performed; IV checked; monitors and equipment checked; informed consent obtained and options, plan, risks & benefits discussed with patientUniversal ProtocolUniversal protocol documented by nurse: noTime out unable to be performed due to emergent nature of case: NoTiming: the time out is initiated after the patient is positioned and prior to the beginning of the procedure: YesName of patient and medical record number or DOB (if MRN is unavailable) stated and checked with ID band or        previously confirmed MEDICAL RECORD NUMBERYesProceduralist states or confirms the procedure to be performed: YesThe procedural consent is used to verify the procedure to be performed: YesSite of procedure(s) (with laterally or level) is topically marked per policy and visible after draping: N/ARadiographic imaging is present or a laterality side/site band is present on patient and is accessible: N/AMedications addressed: YesBlood Addressed: N/AImaging addressed: N/AImplants addressed: N/ASpecial equipment or other needs addressed: YesProceduralist discusses particular challenges or special considerations: YesPre-op Anti-coagulation Ldz:WnwzAjdzopwz Neurological Deficits:NonePatient's pre-procedure mental status:  AwakePrep: chloraprep and patient drapedNeedle A:  epidural Laterality : levelLevel: L3-4Injection technique: DPE (document under additional needles)Needle: Tuohy      Gauge: 17 G      Length: 3.5 inCatheter at skin depth: 13 cmTechnique: Loss of resistance     - Loss of resistance to: saline     - Depth loss of resistance obtained: 8 cm - Number of attempts: 1- Was this a replacement catheter for a dysfunctional catheter? NoEvent(s): blood not aspirated, injection not painful, no injection resistance, no cerebrospinal fluid, no paresthesia and no other eventPerformed By:     - Anesthesiologist: Lenora Carrier, MD, personally, with Attending Anesthesiologist and with CRNA     - CRNA: Daryl Payor, CRNAPatient's position for procedure: sittingLaterality: N/ANeedle B: intrathecal Laterality: levelNeedle: Pencan      Gauge: 25 G      Length: 5 inTechnique: Needle through needle - Number of attempts: 1Event(s): no blood aspirated, no injection painful, no injection resistance, cerebrospinal fluid, no paresthesia and no other event     - Cerebrospinal fluid action: DPEPerformed by:      - Anesthesiologist: Lenora Carrier, MD, with Attending Anesthesiologist, with CRNA and personally      - CRNA: Daryl Payor, CRNANew laterality for needle/catheter 2: noNew location for needle/catheter 2: noNew patient's position for procedure:NoLaterality:  N/APatient's position for procedure:sittingOutcome:  successful blockAttempts:  block completePatient tolerated block procedure well?:  yesAdditional Notes:US  for landmark. Easy, single attempt DPE. Pt tolerated procedure well.

## 2024-02-26 NOTE — Progress Notes [1]
 OB PROGRESS NOTEPatient Data:  Patient Name: GAIL VENDETTI Age: 39 y.o. DOB: Jul 06, 1985	 MRN: [id number]	 S: Patient now comfortable after epiduralO: BP (!) 108/59  - Pulse (!) 97  - Temp 98.6 ?F (37 ?C) (Temporal)  - Resp 18  - Ht 5' 2.5 (1.588 m)  - Wt (!) 161 kg  - LMP 05/14/2023  - SpO2 97%  - Breastfeeding No  - BMI 63.90 kg/m? Cervical Exam:Cervical Exam**Dilation: 5**Effacement (%): 80Cervical Characteristics: Posterior**Station: - : ManualOB Examiner: [employer] Technical [employer] Documentation:Chaperone Present: Yes, sensitive parts of the examination were performed with chaperone presentChaperone Full Name and Role: Rosa RN FHR Assessment: Fetal Heart Rate (last filed; sequential order for multiples):Fetal HR Assessment Method: wirelessFetal HR (beats/min): 145Fetal HR Variability: moderate (amplitude range 6 to 25 bpm)Uterine Contractions:Method: TOCO (external toco transducer), per patient report, palpationContraction Intensity: mild by palpationA/P:Lizzette K Ueda is a 39 y.o. female G3P0020 @ [redacted]w[redacted]d SONDA COPPENS is a 39 y.o. female G3P0020 @ [redacted]w[redacted]d  2/2 00:00 cook placed7:00 cook out exam 1/807:15 oral misoprostol  dose #1 given 13:15 oral misoprostol  dose #4 given15:00 deceleration to nadir 90 terbutaline  given exam 1/80/-316:30 Cook balloon replaced 21:00 pitocin  started 2/34:30 5/80/-35:30 epidural645: AROM bloody fluid Discussed with patient Dr. Andria to assumer her care at 7 2. FWBGBS negCat two though overall moderate variability and accelerationsEFW 3575 (based on 12/26 ultrasound)  3. MWBEncourage ambulation SCDs when lying in bed as we continue to go through this IOLJill Kyandre Okray, MD2/3/20266:55 AM

## 2024-02-26 NOTE — Anesthesia Preprocedure Evaluation [24]
 This is a 39 y.o. female scheduled for VAGINAL DELIVERY.Review of Systems/ Medical History Patient summary, nursing notes, EKG/Cardiac Studies , Labs and pre-procedure vitals, height, weight reviewed.No previous anesthesia concernsAnesthesia Evaluation:   Estimated body mass index.02/24/24 : 63.90 kg/m? Last patient weight recorded. 02/24/24 : (!) 161 kg Last patient height recorded. 02/24/24 : 5' 2.5 (1.588 m) CC/HPI: 39 yo F G3P0020 [redacted]w[redacted]d w/ a PMHx of obesity Meds: No current facility-administered medications on file prior to encounter.Current Outpatient Medications on File Prior to Encounter:drospirenone-ethinyl estradioL (YASMIN) 3-0.03 mg per tablet, Take 1 tablet by mouth daily., Disp: 84 tablet, Rfl: 4nkaPast Surgical History:  Past Surgical History:No date: NO PAST SURGERIESCardiovascular:  Patient has no history of orthopnea.  No angina.  -Exercise tolerance: >4 METS -Vascular Disease:  Negative    Respiratory:  Patient has shortness of breath.-Airway disorders:      -Asthma: noHEENT: Negative.Neuromuscular:  Patient has a history of  no seizures.-Intracranial disorders:  She did not have a cerebrovascular accident  Skeletal/Skin:  NegativeGastrointestinal/Genitourinary: Gastrointestinal Disorders:  Patient has no GERD.Nutritional Disorders: Pt is obese per BMI definition-Hematological/Lymphatic: NegativeEndocrine/Metabolic: -Diabetes mellitus:  The patient does not have diabetes mellitus.Behavioral/Social/Psychiatric & Syndromes: NegativeAdditional Findings: LABS:Lab Results     Component                Value               Date                     WBC                      7.8                 02/25/2024               HGB                      12.1                02/25/2024               HCT                      35.20               02/25/2024               PLT 289                 02/25/2024               ALT                      7                   08/10/2023               AST                      9 (L)               08/10/2023               NA                       136  08/10/2023               K                        4.2                 08/10/2023               CL                       103                 08/10/2023               CREATININE               0.62                08/10/2023               BUN                      12                  08/10/2023               CO2                      26                  08/10/2023               GLU                      89                  08/10/2023               HGBA1C                   5.7 (H)             08/10/2023               TSH                      0.42                05/21/2023          Physical ExamCardiovascular:      Heart Sounds: S1 present and S2 present.Reason: Comments: Pulmonary:    Patient's breath sounds clear to auscultationReason: Comments: Airway:  Mallampati: ITM distance: >3 FBNeck ROM: fullMouth Opening: >3cmReason: Comments: Dental:  Documentation is limited to gross visual examination or patient report and does not reflect any prior dental records or associated imaging. Omissions in documentation do not reflect absence of pathology. unremarkable  Anesthesia PlanASA 3 The primary anesthesia plan is  epidural. Perioperative Code Status confirmed: It is my understanding that the patient is currently designated as 'Full Code' and will remain so throughout the perioperative period.Anesthesia informed consent obtained.  Consent obtained from: patientType of Anesthesia informed consent obtained:  E-consentUse of blood products: consented  The post operative pain plan is neuraxial.Plan discussed with Attending and Resident.Anesthesiologist's Pre Op NoteI personally evaluated and examined the patient prior to the intra-operative phase of care on the day of the procedure.SABRA

## 2024-02-26 NOTE — Progress Notes [1]
 OB PROGRESS NOTEPatient Data:  Patient Name: Amy Paul Age: 39 y.o. DOB: [birth year]	 MRN: [id number]	 S: Pt seen and examined. Some pressure with CTX only. O: BP 137/63  - Pulse (!) 97  - Temp 97.8 ?F (36.6 ?C) (Temporal)  - Resp 18  - Ht 5' 2.5 (1.588 m)  - Wt (!) 161 kg  - LMP 05/14/2023  - SpO2 98%  - Breastfeeding No  - BMI 63.90 kg/m? Cervical Exam:9/100/0Chaperone Flowsheet Documentation:Chaperone Present: Yes, sensitive parts of the examination were performed with chaperone presentChaperone Full Name and Role: CHRISTELLA Public RN FHR Assessment: Difficult to assessFSE attempted to be placed, but unable to stay on fetal head. Will add NOVII. Uterine Contractions:3 CTX in 10 minLatasha K Paul is a 39 y.o. female G3P0020 @ 104w1d  2/2 00:00 cook placed7:00 cook out exam 1/807:15 oral misoprostol  dose #1 given 13:15 oral misoprostol  dose #4 given15:00 deceleration to nadir 90 terbutaline  given exam 1/80/-316:30 Cook balloon replaced 21:00 pitocin  started 2/34:30 5/80/-35:30 epidural645: AROM bloody fluid0900: 8/100/0 - pt turned on side and peanut ball recommended, meconium fluid - pt aware that peds will be in attendance for delivery 1150: unchanged1420: 9/100/0 - cont pitocin   2. FWBGBS negCat 1EFW 3575 (based on 12/26 ultrasound)  3. MWBEncourage ambulation SCDs when lying in bed as we continue to go through this [employer], MD2/3/20262:30 PM

## 2024-02-26 NOTE — Progress Notes [1]
 OB PROGRESS NOTEPatient Data:  Patient Name: Amy Paul Age: 39 y.o. DOB: 1985/08/28	 MRN: [id number]	 S: Pt seen and examined. No complaints. O: BP 133/62  - Pulse (!) 93  - Temp (!) 100.8 ?F (38.2 ?C) (Temporal)  - Resp 16  - Ht 5' 2.5 (1.588 m)  - Wt (!) 161 kg  - LMP 05/14/2023  - SpO2 98%  - Breastfeeding No  - BMI 63.90 kg/m? Cervical Exam:Unchanged exam since 1430Cervical swelling palpatedChaperone Flowsheet Documentation:Chaperone Present: Yes, sensitive parts of the examination were performed with chaperone presentChaperone Full Name and Role: CHRISTELLA Public RN FHR Assessment: 150 bpm, mod var, no accels, +late decelsUterine Contractions:3 CTX in 10 minutes Amy Paul is a 39 y.o. female G3P0020 @ [redacted]w[redacted]d  2/2 00:00 cook placed7:00 cook out exam 1/807:15 oral misoprostol  dose #1 given 13:15 oral misoprostol  dose #4 given15:00 deceleration to nadir 90 terbutaline  given exam 1/80/-316:30 Cook balloon replaced 21:00 pitocin  started 2/34:30 5/80/-35:30 epidural645: AROM bloody fluid0900: 8/100/0 - pt turned on side and peanut ball recommended, meconium fluid - pt aware that peds will be in attendance for delivery 1150: unchanged1420: 9/100/0 - cont pitocin1530: 9/100/0 - FSE placed, cervical swelling, benadryl  given1730: 9/100/0, no change in exam and increased swelling, now with recurrent late decels and pt now with 100.8 temp - likely chorio 2. FWBGBS negCat 1EFW 3575 (based on 12/26 ultrasound)  3. MWBEncourage ambulation SCDs when lying in bed as we continue to go through this IOLDiscussed with pt that due to the recurrent late decels, chorio, cervical swelling and no change in exam for approximately 3-4 hours - I do not recommend continuing the induction process. I recommend c-section for NRFHT and inability to continue pitocin . Pt okay with plan.I discussed with the patient the risks of a cesarean section. We discussed the risks of bleeding, infection, and pain. If bleeding were to occur, she may need a blood transfusion. She is accepting of blood transfusion and understands that there are risks associated with that, as well. We discussed there other risks of c-section include injury to the surrounding organs, including bowel, bladder, blood vessels and baby. If these were to be injured, further surgery may be needed. Surgery has risks including risk of blood clots, stroke, paralysis and death. I discussed the purpose of the surgery. All questions were answered to the best of my ability. Consents were signed.Verne Lanuza S Aleisa Howk, MD2/03/2024 5:33 PM  Myron GORMAN Frank, MD2/3/20265:27 PM

## 2024-02-27 ENCOUNTER — Inpatient Hospital Stay: Admit: 2024-02-27 | Discharge: 2024-02-27 | Payer: Managed Care (Private)

## 2024-02-27 LAB — MANUAL DIFFERENTIAL
BKR WAM BASOPHIL - ABS (DIFF) 2 DEC: 0 x 1000/ÂµL (ref 0.00–1.00)
BKR WAM BASOPHILS (DIFF): 0 % (ref 0.0–1.4)
BKR WAM EOSINOPHILS (DIFF) 2 DEC: 0 x 1000/ÂµL (ref 0.00–1.00)
BKR WAM EOSINOPHILS (DIFF): 0 % (ref 0.0–5.0)
BKR WAM LYMPHOCYTE - ABS (DIFF) 2 DEC: 0.28 x 1000/ÂµL — ABNORMAL LOW (ref 0.60–3.70)
BKR WAM LYMPHOCYTES (DIFF): 1.7 % — ABNORMAL LOW (ref 17.0–50.0)
BKR WAM MONOCYTE - ABS (DIFF) 2 DEC: 1.87 x 1000/ÂµL — ABNORMAL HIGH (ref 0.00–1.00)
BKR WAM MONOCYTES (DIFF): 11.3 % (ref 4.0–12.0)
BKR WAM NEUTROPHILS (DIFF): 87 % — ABNORMAL HIGH (ref 39.0–72.0)
BKR WAM NEUTROPHILS - ABS (DIFF) 2 DEC: 14.43 x 1000/ÂµL — ABNORMAL HIGH (ref 2.00–7.60)

## 2024-02-27 LAB — CBC WITH AUTO DIFFERENTIAL
BKR WAM ABSOLUTE IMMATURE GRANULOCYTES.: 0.21 x 1000/ÂµL (ref 0.00–0.30)
BKR WAM ABSOLUTE LYMPHOCYTE COUNT.: 0.6 x 1000/ÂµL (ref 0.60–3.70)
BKR WAM ABSOLUTE NRBC: 0 x 1000/ÂµL (ref 0.00–1.00)
BKR WAM ABSOLUTE NRBC: 0 x 1000/ÂµL (ref 0.00–1.00)
BKR WAM ANC (ABSOLUTE NEUTROPHIL COUNT): 16.77 x 1000/ÂµL — ABNORMAL HIGH (ref 2.00–7.60)
BKR WAM BASOPHIL ABSOLUTE COUNT.: 0.01 x 1000/ÂµL (ref 0.00–1.00)
BKR WAM BASOPHILS: 0.1 % (ref 0.0–1.4)
BKR WAM EOSINOPHIL ABSOLUTE COUNT.: 0 x 1000/ÂµL (ref 0.00–1.00)
BKR WAM EOSINOPHILS: 0 % (ref 0.0–5.0)
BKR WAM HEMATOCRIT: 28.9 % — ABNORMAL LOW (ref 35.00–45.00)
BKR WAM HEMATOCRIT: 30.1 % — ABNORMAL LOW (ref 35.00–45.00)
BKR WAM HEMOGLOBIN: 9.3 g/dL — ABNORMAL LOW (ref 11.7–15.5)
BKR WAM HEMOGLOBIN: 10.1 g/dL — ABNORMAL LOW (ref 11.7–15.5)
BKR WAM IMMATURE GRANULOCYTES: 1.1 % — ABNORMAL HIGH (ref 0.0–1.0)
BKR WAM LYMPHOCYTES: 3.2 % — ABNORMAL LOW (ref 17.0–50.0)
BKR WAM MCH: 27.4 pg (ref 27.0–33.0)
BKR WAM MCH: 28.6 pg (ref 27.0–33.0)
BKR WAM MCHC: 32.2 g/dL (ref 31.0–36.0)
BKR WAM MCHC: 33.6 g/dL (ref 31.0–36.0)
BKR WAM MCV: 85.3 fL (ref 80.0–100.0)
BKR WAM MCV: 85.3 fL (ref 80.0–100.0)
BKR WAM MONOCYTE ABSOLUTE COUNT.: 1.34 x 1000/ÂµL — ABNORMAL HIGH (ref 0.00–1.00)
BKR WAM MONOCYTES: 7.1 % (ref 4.0–12.0)
BKR WAM MPV: 9.5 fL (ref 8.0–12.0)
BKR WAM MPV: 9.8 fL (ref 8.0–12.0)
BKR WAM NEUTROPHILS: 88.5 % — ABNORMAL HIGH (ref 39.0–72.0)
BKR WAM NUCLEATED RED BLOOD CELLS: 0 % (ref 0.0–1.0)
BKR WAM NUCLEATED RED BLOOD CELLS: 0 % (ref 0.0–1.0)
BKR WAM PLATELETS: 225 10*3/uL (ref 150–420)
BKR WAM PLATELETS: 227 10*3/uL (ref 150–420)
BKR WAM RDW-CV: 14.6 % (ref 11.0–15.0)
BKR WAM RDW-CV: 14.5 % (ref 11.0–15.0)
BKR WAM RED BLOOD CELL COUNT.: 3.39 M/ÂµL — ABNORMAL LOW (ref 4.00–6.00)
BKR WAM RED BLOOD CELL COUNT.: 3.53 M/ÂµL — ABNORMAL LOW (ref 4.00–6.00)
BKR WAM WHITE BLOOD CELL COUNT: 16.6 10*3/uL — ABNORMAL HIGH (ref 4.0–11.0)
BKR WAM WHITE BLOOD CELL COUNT: 18.9 10*3/uL — ABNORMAL HIGH (ref 4.0–11.0)

## 2024-02-27 LAB — BASIC METABOLIC PANEL
BKR ANION GAP: 11 (ref 7–17)
BKR BLOOD UREA NITROGEN: 11 mg/dL (ref 6–20)
BKR BUN / CREAT RATIO: 14.5 (ref 8.0–23.0)
BKR CALCIUM: 8.7 mg/dL — ABNORMAL LOW (ref 8.8–10.2)
BKR CHLORIDE: 103 mmol/L (ref 98–107)
BKR CO2: 21 mmol/L (ref 20–30)
BKR CREATININE DELTA: -0.26
BKR CREATININE: 0.76 mg/dL (ref 0.51–0.95)
BKR EGFR, CREATININE (CKD-EPI 2021): >60 mL/min/{1.73_m2} (ref >=60)
BKR GLUCOSE: 134 mg/dL (ref 70–140)
BKR POTASSIUM: 4.8 mmol/L (ref 3.3–5.5)
BKR SODIUM: 135 mmol/L — ABNORMAL LOW (ref 136–145)

## 2024-02-27 LAB — COMPREHENSIVE METABOLIC PANEL
BKR A/G RATIO: 0.9 — ABNORMAL LOW (ref 1.0–2.2)
BKR ALANINE AMINOTRANSFERASE (ALT): 9 U/L (ref <=35)
BKR ALBUMIN: 2.7 g/dL — ABNORMAL LOW (ref 3.6–5.1)
BKR ALKALINE PHOSPHATASE: 115 U/L — ABNORMAL HIGH (ref 35–104)
BKR ANION GAP: 14 (ref 7–17)
BKR ASPARTATE AMINOTRANSFERASE (AST): 24 U/L (ref <35)
BKR AST/ALT RATIO: 2.7
BKR BILIRUBIN TOTAL: 0.4 mg/dL (ref <=1.2)
BKR BLOOD UREA NITROGEN: 14 mg/dL (ref 6–20)
BKR BUN / CREAT RATIO: 13.7 (ref 8.0–23.0)
BKR CALCIUM: 8.4 mg/dL — ABNORMAL LOW (ref 8.8–10.2)
BKR CHLORIDE: 102 mmol/L (ref 98–107)
BKR CO2: 18 mmol/L — ABNORMAL LOW (ref 20–30)
BKR CREATININE DELTA: 0.4 — ABNORMAL HIGH
BKR CREATININE: 1.02 mg/dL — ABNORMAL HIGH (ref 0.51–0.95)
BKR EGFR, CREATININE (CKD-EPI 2021): >60 mL/min/{1.73_m2} (ref >=60)
BKR GLOBULIN: 2.9 g/dL (ref 1.9–3.9)
BKR GLUCOSE: 97 mg/dL (ref 70–140)
BKR POTASSIUM: 4.2 mmol/L (ref 3.3–5.5)
BKR PROTEIN TOTAL: 5.6 g/dL — ABNORMAL LOW (ref 5.9–8.3)
BKR SODIUM: 134 mmol/L — ABNORMAL LOW (ref 136–145)

## 2024-02-27 LAB — CREATININE/EGFR
BKR CREATININE DELTA: 0.4 — ABNORMAL HIGH
BKR CREATININE: 1.02 mg/dL — ABNORMAL HIGH (ref 0.51–0.95)
BKR EGFR, CREATININE (CKD-EPI 2021): >60 mL/min/{1.73_m2} (ref >=60)

## 2024-02-27 LAB — FIBRINOGEN     (BH GH L LMW YH): BKR FIBRINOGEN LEVEL: 525 mg/dL — ABNORMAL HIGH (ref 200–450)

## 2024-02-27 LAB — PT/INR AND PTT (BH GH L LMW YH)
BKR INR: 1.06 (ref 0.90–1.10)
BKR PARTIAL THROMBOPLASTIN TIME: 27.8 s (ref 23.0–31.0)
BKR PROTHROMBIN TIME: 11.4 s (ref 9.0–12.0)

## 2024-02-27 MED ORDER — ENOXAPARIN 40 MG/0.4 ML SUBCUTANEOUS SYRINGE
40 | Freq: Two times a day (BID) | SUBCUTANEOUS | Status: DC
Start: 2024-02-27 — End: 2024-02-29
  Administered 2024-02-27 – 2024-02-28 (×2): 40 via SUBCUTANEOUS

## 2024-02-27 MED ORDER — HYDROMORPHONE 2 MG/ML INJECTION SOLUTION
2 | INTRAVENOUS | 1 refills | Status: DC | PRN
Start: 2024-02-27 — End: 2024-02-27
  Administered 2024-02-27: 03:00:00 2 mL via INTRAVENOUS

## 2024-02-27 MED ORDER — METHYLPREDNISOLONE ACETATE 40 MG/ML SUSPENSION FOR INJECTION
40 | PERINEURAL | Status: DC | PRN
Start: 2024-02-27 — End: 2024-02-27
  Administered 2024-02-27 (×2): 40 mg/mL via PERINEURAL

## 2024-02-27 MED ORDER — DEXAMETHASONE SODIUM PHOSPHATE (PF) 10 MG/ML INJECTION SOLUTION
10 | INTRAVENOUS | Status: DC | PRN
Start: 2024-02-27 — End: 2024-02-27

## 2024-02-27 MED ORDER — HYDROCORTISONE 1 % TOPICAL CREAM
1 | Freq: Two times a day (BID) | TOPICAL | Status: DC | PRN
Start: 2024-02-27 — End: 2024-02-29

## 2024-02-27 MED ORDER — ONDANSETRON HCL (PF) 4 MG/2 ML INJECTION SOLUTION
4 | Freq: Four times a day (QID) | INTRAVENOUS | Status: DC | PRN
Start: 2024-02-27 — End: 2024-02-29

## 2024-02-27 MED ORDER — IBUPROFEN 600 MG TABLET
600 | Freq: Four times a day (QID) | ORAL | Status: DC
Start: 2024-02-27 — End: 2024-02-29
  Administered 2024-02-28: 01:00:00 600 mg via ORAL

## 2024-02-27 MED ORDER — BUPIVACAINE (PF) 0.25 % (2.5 MG/ML) INJECTION SOLUTION
0.25 | Status: CP
Start: 2024-02-27 — End: ?

## 2024-02-27 MED ORDER — SIMETHICONE 125 MG CHEWABLE TABLET
125 | Freq: Four times a day (QID) | ORAL | Status: DC | PRN
Start: 2024-02-27 — End: 2024-02-29
  Administered 2024-02-27 – 2024-02-28 (×3): 125 mg via ORAL

## 2024-02-27 MED ORDER — OXYTOCIN 10 UNIT/ML INJECTION SOLUTION
10 | Freq: Once | INTRAMUSCULAR | Status: CP
Start: 2024-02-27 — End: ?
  Administered 2024-02-27: 01:00:00 10 mL via INTRAMUSCULAR

## 2024-02-27 MED ORDER — METHYLERGONOVINE 0.2 MG/ML (1 ML) INJECTION SOLUTION
0.2 | INTRAMUSCULAR | Status: DC | PRN
Start: 2024-02-27 — End: 2024-02-27
  Administered 2024-02-27: 0.2 mg/mL via INTRAMUSCULAR

## 2024-02-27 MED ORDER — LACTATED RINGERS INTRAVENOUS SOLUTION
INTRAVENOUS | Status: DC | PRN
Start: 2024-02-27 — End: 2024-02-27
  Administered 2024-02-26: via INTRAVENOUS

## 2024-02-27 MED ORDER — ONDANSETRON 4 MG DISINTEGRATING TABLET
4 | Freq: Four times a day (QID) | ORAL | Status: DC | PRN
Start: 2024-02-27 — End: 2024-02-29

## 2024-02-27 MED ORDER — ONDANSETRON HCL (PF) 4 MG/2 ML INJECTION SOLUTION
4 | INTRAVENOUS | Status: DC | PRN
Start: 2024-02-27 — End: 2024-02-27
  Administered 2024-02-27: 4 via INTRAVENOUS

## 2024-02-27 MED ORDER — ASCORBIC ACID (VITAMIN C) 250 MG TABLET
250 | ORAL | Status: DC
Start: 2024-02-27 — End: 2024-02-29
  Administered 2024-02-27: 13:00:00 250 mg via ORAL

## 2024-02-27 MED ORDER — OXYCODONE (ROXICODONE) IMMEDIATE RELEASE 2.5 MG HALFTAB
2.5 | ORAL | Status: DC | PRN
Start: 2024-02-27 — End: 2024-02-29
  Administered 2024-02-28: 01:00:00 2.5 mg via ORAL

## 2024-02-27 MED ORDER — MORPHINE (PF) 1 MG/ML INJECTION SOLUTION
1 | EPIDURAL | Status: DC | PRN
Start: 2024-02-27 — End: 2024-02-27
  Administered 2024-02-27: 01:00:00 1 mg/mL via EPIDURAL

## 2024-02-27 MED ORDER — OXYTOCIN IN 0.9% SODIUM CHLORIDE 30 UNIT/500 ML INTRAVENOUS SOLUTION (POSTPARTUM)
30 | INTRAVENOUS | Status: DC
Start: 2024-02-27 — End: 2024-02-29

## 2024-02-27 MED ORDER — METHYLPREDNISOLONE ACETATE 40 MG/ML SUSPENSION FOR INJECTION
40 | INTRAMUSCULAR | Status: DC | PRN
Start: 2024-02-27 — End: 2024-02-27

## 2024-02-27 MED ORDER — CARBOPROST TROMETHAMINE 250 MCG/ML INTRAMUSCULAR SOLUTION
250 | Status: CP
Start: 2024-02-27 — End: ?

## 2024-02-27 MED ORDER — ACETAMINOPHEN 325 MG TABLET
325 | Freq: Four times a day (QID) | ORAL | Status: DC
Start: 2024-02-27 — End: 2024-02-29
  Administered 2024-02-27 – 2024-02-28 (×4): 325 mg via ORAL

## 2024-02-27 MED ORDER — OXYTOCIN IN 0.9% SODIUM CHLORIDE 30 UNIT/500 ML INTRAVENOUS SOLUTION (POSTPARTUM)
30 | INTRAVENOUS | Status: DC | PRN
Start: 2024-02-27 — End: 2024-02-27
  Administered 2024-02-27: 30 mL/h via INTRAVENOUS

## 2024-02-27 MED ORDER — GLYCERIN-WITCH HAZEL 12.5 %-50 % TOPICAL PADS
12.5-50 | TOPICAL | Status: DC | PRN
Start: 2024-02-27 — End: 2024-02-29

## 2024-02-27 MED ORDER — CEPHALEXIN 500 MG CAPSULE
500 | Freq: Three times a day (TID) | ORAL | Status: DC
Start: 2024-02-27 — End: 2024-02-29
  Administered 2024-02-27 – 2024-02-28 (×3): 500 mg via ORAL

## 2024-02-27 MED ORDER — PRENATAL VIT,CALCIUM 27-FERROUS FUM 60 MG IRON-FOLIC ACID 1 MG TABLET
60 | Freq: Every day | ORAL | Status: DC
Start: 2024-02-27 — End: 2024-02-29
  Administered 2024-02-27: 13:00:00 60 mg iron-1 mg via ORAL

## 2024-02-27 MED ORDER — DEXAMETHASONE SODIUM PHOSPHATE 4 MG/ML INJECTION SOLUTION
4 | INTRAVENOUS | Status: DC | PRN
Start: 2024-02-27 — End: 2024-02-27
  Administered 2024-02-27: 4 mg/mL via INTRAVENOUS

## 2024-02-27 MED ORDER — MODIFIED LANOLIN 100 % TOPICAL CREAM
100 | TOPICAL | Status: DC | PRN
Start: 2024-02-27 — End: 2024-02-29

## 2024-02-27 MED ORDER — KETOROLAC 15 MG/ML INJECTION SOLUTION
15 | Freq: Four times a day (QID) | INTRAVENOUS | Status: CP
Start: 2024-02-27 — End: ?
  Administered 2024-02-27 (×3): 15 mL via INTRAVENOUS

## 2024-02-27 MED ORDER — MISOPROSTOL 200 MCG TABLET
200 | Freq: Once | RECTAL | Status: CP
Start: 2024-02-27 — End: ?
  Administered 2024-02-27: 01:00:00 200 ug via RECTAL

## 2024-02-27 MED ORDER — OXYCODONE IMMEDIATE RELEASE 5 MG TABLET
5 | ORAL | Status: DC | PRN
Start: 2024-02-27 — End: 2024-02-29

## 2024-02-27 MED ORDER — LOPERAMIDE 2 MG CAPSULE
2 | Freq: Four times a day (QID) | ORAL | Status: DC | PRN
Start: 2024-02-27 — End: 2024-02-29
  Administered 2024-02-27: 01:00:00 2 mg via ORAL

## 2024-02-27 MED ORDER — FERROUS SULFATE 325 MG (65 MG IRON) TABLET
325 | ORAL | Status: DC
Start: 2024-02-27 — End: 2024-02-29
  Administered 2024-02-27: 13:00:00 325 mg (65 mg iron) via ORAL

## 2024-02-27 MED ORDER — BUPIVACAINE (PF) 0.25 % (2.5 MG/ML) INJECTION SOLUTION
0.25 | PERINEURAL | Status: DC | PRN
Start: 2024-02-27 — End: 2024-02-27
  Administered 2024-02-27 (×2): 0.25 % (2.5 mg/mL) via PERINEURAL

## 2024-02-27 MED ORDER — DEXAMETHASONE SODIUM PHOSPHATE (PF) 10 MG/ML INJECTION SOLUTION
10 | Status: CP
Start: 2024-02-27 — End: ?

## 2024-02-27 MED ORDER — CARBOPROST TROMETHAMINE 250 MCG/ML INTRAMUSCULAR SOLUTION
250 | INTRAMUSCULAR | Status: DC | PRN
Start: 2024-02-27 — End: 2024-02-27
  Administered 2024-02-27: 01:00:00 250 ug/mL via INTRAMUSCULAR

## 2024-02-27 MED ORDER — LACTATED RINGERS INTRAVENOUS SOLUTION
INTRAVENOUS | Status: DC
Start: 2024-02-27 — End: 2024-02-29
  Administered 2024-02-27: 03:00:00 1000.000 mL/h via INTRAVENOUS

## 2024-02-27 MED ORDER — DIPHENHYDRAMINE 25 MG ORAL TAB/CAP (WRAPPED ERX)
25 | ORAL | Status: DC | PRN
Start: 2024-02-27 — End: 2024-02-29

## 2024-02-27 MED ORDER — BENZOCAINE 20 %-MENTHOL 0.5 % TOPICAL AEROSOL
20-0.5 | Freq: Four times a day (QID) | TOPICAL | Status: DC | PRN
Start: 2024-02-27 — End: 2024-02-29

## 2024-02-27 MED ORDER — SENNOSIDES 8.6 MG TABLET
8.6 | Freq: Every day | ORAL | Status: DC | PRN
Start: 2024-02-27 — End: 2024-02-29

## 2024-02-27 MED ORDER — FENTANYL (PF) 50 MCG/ML (WRAPPED ERX) INJECTION
50 | INTRAVENOUS | 1 refills | Status: DC | PRN
Start: 2024-02-27 — End: 2024-02-27

## 2024-02-27 MED ORDER — LACTATED RINGERS INTRAVENOUS SOLUTION
INTRAVENOUS | Status: DC | PRN
Start: 2024-02-27 — End: 2024-02-27
  Administered 2024-02-27 (×2): via INTRAVENOUS

## 2024-02-27 MED ORDER — MORPHINE (PF) 1 MG/ML INJECTION SOLUTION
1 | Status: CP
Start: 2024-02-27 — End: ?

## 2024-02-27 MED ORDER — LOPERAMIDE 2 MG CAPSULE
2 | Freq: Four times a day (QID) | ORAL | Status: DC | PRN
Start: 2024-02-27 — End: 2024-02-27

## 2024-02-27 MED ORDER — TOBRAMYCIN HIGH DOSE 7 MG/KG (ADULT) IVPB
Freq: Once | INTRAVENOUS | Status: CP
Start: 2024-02-27 — End: ?
  Administered 2024-02-27: 02:00:00 100.000 mL/h via INTRAVENOUS

## 2024-02-27 MED ORDER — POLYETHYLENE GLYCOL 3350 17 GRAM ORAL POWDER PACKET
17 | Freq: Every day | ORAL | Status: DC
Start: 2024-02-27 — End: 2024-02-29
  Administered 2024-02-27: 13:00:00 17 g via ORAL

## 2024-02-27 MED ORDER — METRONIDAZOLE 500 MG TABLET
500 | Freq: Three times a day (TID) | ORAL | Status: DC
Start: 2024-02-27 — End: 2024-02-29
  Administered 2024-02-27 – 2024-02-28 (×3): 500 mg via ORAL

## 2024-02-27 MED ORDER — METHYLPREDNISOLONE ACETATE 40 MG/ML SUSPENSION FOR INJECTION
40 | Status: CP
Start: 2024-02-27 — End: ?

## 2024-02-27 MED ORDER — NALOXONE 0.4 MG/ML INJECTION SOLUTION
0.4 | INTRAVENOUS | Status: DC | PRN
Start: 2024-02-27 — End: 2024-02-27

## 2024-02-27 MED ORDER — BUPIVACAINE (PF) 0.25 % (2.5 MG/ML) INJECTION SOLUTION
0.25 | EPIDURAL | Status: DC | PRN
Start: 2024-02-27 — End: 2024-02-27

## 2024-02-27 MED ORDER — DEXAMETHASONE SODIUM PHOSPHATE (PF) 10 MG/ML INJECTION SOLUTION
10 | PERINEURAL | Status: DC | PRN
Start: 2024-02-27 — End: 2024-02-27
  Administered 2024-02-27 (×2): 10 mg/mL via PERINEURAL

## 2024-02-27 NOTE — Plan of Care [1000001]
 Plan of Care Overview/ Patient [place] is day 1 sp c-section to baby girl in NNICU at 41wk1d. VSS, postpartum assessment as documented. Voiding spontaneously. incision with pressure dressing in place, CDI. Plans to pump for baby in NNICU. Denies HA or vision changes. Intermittently off unit to see baby in NNICU, safety education provided. ID bands verified. Safety maintained, see flowsheet for further details.Education provided about the benefits to breastfeeding for mother and for newborn infant to ensure an informed infant feeding decision. Discussed the recommendations by the World Health Organization and the American Academy of Pediatrics of exclusive breastfeeding for the first 6 months after birth with continued breastfeeding along with appropriate foods for 1 year or longer as mutually desired by mother and infant. Questions about breastfeeding encouraged and concerns addressed.

## 2024-02-27 NOTE — Plan of Care [1000001]
 Plan of Care Overview/ Patient StatusPatient arrived from labor and birth at 22, admitted to EP 4-8 following a Cesarean at 41 weeks 1 day. Breastfeeding preferred. Parents oriented to mother/baby unit and safety protocols reviewed. Newborn in NNICU. Newborn education provided. Patient is comfortable in bed. Lung sounds clear on auscultation. VSS. Uterus not easily palpable on exam, palpation limited by habitus. Lochia rubra, moderate discharge. Pressure dressing to incision site. Bowel sounds hypoactive. Patient not passing flatus yet. Foley catheter removed at 0700, 1200 cc urine output emptied. Standby assist OOB, assist x2. Moderate pain, scheduled Tylenol  and Toradol  administered. FOB at bedside attentive to mom and supportive. Will continue with IPOC. Education and support provided as needed.  Education provided about the benefits to breastfeeding for mother and for newborn infant to ensure an informed infant feeding decision. Discussed the recommendations by the World Health Organization and the American Academy of Pediatrics of exclusive breastfeeding for the first 6 months after birth with continued breastfeeding along with appropriate foods for 1 year or longer as mutually desired by mother and infant. Questions about breastfeeding encouraged and concerns addressed. Educated about the importance of infant's safe sleep environment, including back to sleep, no extra blankets or loose bedding, no stuffed animals or toys in crib and placing infant in his/her own bassinet or crib for sleep. Educated about the benefits of skin to skin contact including maintaining infant warmth, regulating infant blood glucose, regulating infant respirations and heart rate, maternal infant bonding and improved breastfeeding outcomes. Educated about infant safety during skin to skin contact. Discharge instructions and ?Understanding Mother & Baby Care? booklet reviewed with patient. Educated patient in hand expression of breast milk. Demonstration of hand expression provided and return demonstration received. Indication for hand expression of breast milk taught including improving milk supply, encouraging the baby to latch and softening full breasts. Written education about hand expression technique provided.   Educated about the importance of infant's safe sleep environment, including back to sleep, no extra blankets or loose bedding, no stuffed animals or toys in crib and placing infant in his/her own bassinet or crib for sleep.

## 2024-02-27 NOTE — PACU Transfer of Care [100004]
 Post Anesthesia Transfer of Care NotePatient: Amy K WilliamsProcedure(s) Performed: Procedure(s) (LRB):CESAREAN DELIVERY ONLY (N/A)Last Vitals: I have reviewed the post-operative vital signs during the handoff as noted in the Epic chart.POSTOP HANDOFF :      Patient Location:  PACU     Level of Consciousness:  Awake and alert     VS stable since last recorded intra-op set? Yes       Oxygen source: room airPatient co-morbidities, intra-operative course, intake & output and antibiotics as per Anesthesia record were discussed with the RN.

## 2024-02-27 NOTE — Operative Note [1000004]
 [facility]	 [facility]	Operative Report CONFIDENTIAL - DO NOT COPY WITHOUT APPROPRIATE AUTHORIZATION Patient Name: Amy Paul [facility]: [id number] Surgery Date: 2/3/2026Surgeons and Role:   * Cornellius Kropp, Myron Griffes, MD - PrimaryCirculator: Blas Connor, RN; Layman Neptune, RNRelief Circulator: Bennet Andrea HERO, RNScrub Person: Con Fees: Leola Linn Bucks, MDPREOP DIAGNOSIS: 39 yo G3P0020 at 41.1 weeks with NRFHT, failed IOL.POSTOP DIAGNOSIS: Same, delivered a viable female infant.  Procedure(s) and Anesthesia Type:   * CESAREAN DELIVERY ONLY - SPINALOperative Findings: Normal tubes, ovaries and uterus. Live female infant in OP presentation, weighing 7 lb 11 oz.  Blood and Blood Products:none     Drains: Urinary Catheter (Foley) Specimens: * No specimens in log * EBL: 1500ANESTHESIA: Spinal. PREPARATION: Chloraprep PROCEDURE: PROCEDURE: The patient was brought into the Operating Room and spinal analgesia administered by the anesthesia team. She was then placed in a supine position with a left lateral tilt. The standard prep and drape was performed. The usual time-out procedure was performed. A Pfannenstiel incision was made two fingerbreadths above the symphysis. This was done initially with the scalpel and carried down through the subcutaneous tissues with scalpel and blunt dissection. The fascia was nicked in midline in a horizontal fashion and then the incision extended laterally in each direction with sharp dissection. The fascia was then dissected from the underlying bellies of the rectus muscles superiorly and inferiorly. The rectus was divided sharply and bluntly in the midline, and the peritoneum entered bluntly superiorly, with care not to injure bowel or bladder. We then extended the peritoneal incision superiorly and inferiorly, again with care not to injure bowel or bladder. The bladder was retracted inferiorly. A hysterotomy incision was made in a horizontal fashion in the lower uterine segment initially with the scalpel, and then extending laterally in each direction with surgeons digits. Amniotomy was performed and clear fluid obtained. The operator's hand was inserted and the vertex elevated. Then with fundal pressure, the infant was delivered without difficulty. The cord was clamped and cut. Delayed cord clamping was not done. The infant was handed to the pediatricians in attendance. The placenta was delivered manually. It was grossly normal. The uterus was then wiped clean, no retained tissue noted. The uterus was delivered through the abdominal wall incision. The fallopian tubes and ovaries were noted to be normal bilaterally. The edges of the hysterotomy were grasped and the closure started with a running locking 1-0 Vicryl suture. . A second layer of 0 monocryl sutures were used for hemostasis. Uterine tone was boggy so methergine and hemabate  were given. Then misoprostol  PR at the end. The uterus was replaced into the abdominal wall cavity. The gutters were examined and found to be dry. Hemostasis was rechecked and was excellent. Surgicel and Snow were placed on the uterine incision. We then began closure of the abdominal wall. The fascia was closed in a running fashion beginning at each angle and running to the midline using 0 Vicryl. The subcutaneous space was irrigated and inspected for hemostasis. Any oozing vessels were controlled with electrocautery. Interrupted plain sutures were placed in the subcutaneous fat. The skin was closed with 4-0 monocryl subcuticular closure. Steri-Strips were placed and then a gauze dressing. The uterus was firm at the end of the case. The sponge, instrument, and needle count was correct x2. The wanding procedure was negative for any retained sponges. The urine was clear in the Foley. Myron GORMAN Frank, MD2/3/20267:49 PM

## 2024-02-27 NOTE — Plan of Care [1000001]
 Plan of Care Overview/ Patient StatusPt presently in OR for primary c-section for non reassuring FHR tracing. Pt ruled in for chorio, maternal max temp of 101.3, IV amp and PO tylenol  given. Baby girl delivered at 1905, transferred to NNICU for maternal chorio. Foley draining yellow urine. Safety maintained. Questions answered. Handoff given to oncoming RN

## 2024-02-27 NOTE — Anesthesia Procedure/Diagnosis Confirmation Note [112001]
 Operative Diagnosis:Pre-op:   * No pre-op diagnosis entered * Patient Coded Diagnosis   None  Patient Diagnosis   None    * No Diagnosis Codes entered *Operative Procedure(s) :Procedure(s) (LRB):CESAREAN DELIVERY ONLY (N/A)Post-op Procedure & Diagnosis ConfirmationPost-op Diagnosis: Post-op Diagnosis updated (see notes)     - failed IOLPost-op Procedure: Post-op Procedure confirmed (no changes)

## 2024-02-27 NOTE — Progress Notes [1]
 Obstetrics - Post Cesarean Section Progress NotePatient Info:Amy Paul 39 y.o. female [id number] who is POD#1 (date of delivery: 02/26/24) s/p pLTCS at [redacted]w[redacted]d for NRFHT RFD. Patient initially admitted for eIOL and antepartum course complicated by elevated BP without dx of cHTN, SMA carrier. Subjective 24-Hour Events: - 1x high MRBPToday: The patient is overall recovering well post-operatively. Her pain is well controlled on current pain regimen. She is tolerating PO without n/v. Vaginal bleeding is minimal. OOB to bathroom without lightheadedness/dizziness. Foley out, no void. She is not passing flatus. She denies fevers, chills, HA, visual disturbances, SOB, CP, RUQ pain, N/V.Objective Vital signs:Patient Vitals for the past 24 hrs: BP Temp Temp src Pulse Resp SpO2 02/27/24 0642 125/89 98 ?F (36.7 ?C) -- 70 18 98 % 02/27/24 0036 121/78 98.2 ?F (36.8 ?C) Oral 78 18 97 % 02/26/24 2308 137/72 98.5 ?F (36.9 ?C) Temporal 89 20 100 % 02/26/24 2307 -- -- -- 87 -- 100 % 02/26/24 2306 -- -- -- 87 -- 100 % 02/26/24 2305 -- -- -- 87 -- 100 % 02/26/24 2304 -- -- -- (!) 91 -- 100 % 02/26/24 2303 -- -- -- (!) 95 -- 100 % 02/26/24 2302 -- -- -- 87 -- 100 % 02/26/24 2301 -- -- -- 84 -- 100 % 02/26/24 2300 -- -- -- 84 -- 100 % 02/26/24 2259 -- -- -- 84 -- 100 % 02/26/24 2258 -- -- -- 84 -- 100 % 02/26/24 2257 -- -- -- 85 -- 100 % 02/26/24 2256 -- -- -- 87 -- 100 % 02/26/24 2255 -- -- -- 85 -- 100 % 02/26/24 2254 -- -- -- 84 -- 100 % 02/26/24 2253 -- -- -- 85 -- 100 % 02/26/24 2252 -- -- -- 85 -- 100 % 02/26/24 2251 -- -- -- 86 -- 100 % 02/26/24 2250 -- -- -- 85 -- 100 % 02/26/24 2249 -- -- -- 86 -- 100 % 02/26/24 2248 -- -- -- 86 -- 100 % 02/26/24 2247 -- -- -- 85 -- 100 % 02/26/24 2246 -- -- -- 85 -- 100 % 02/26/24 2245 -- -- -- 86 -- 100 % 02/26/24 2244 -- -- -- 85 -- 100 % 02/26/24 2243 -- -- -- 85 -- 100 % 02/26/24 2242 -- -- -- 85 -- 100 % 02/26/24 2241 -- -- -- 84 -- 100 % 02/26/24 2240 -- -- -- 86 -- 100 % 02/26/24 2239 120/61 -- -- 83 18 100 % 02/26/24 2238 -- -- -- 81 -- 100 % 02/26/24 2237 -- -- -- 85 -- 100 % 02/26/24 2236 -- -- -- 83 -- 100 % 02/26/24 2235 -- -- -- 80 -- 100 % 02/26/24 2234 -- -- -- 81 -- 100 % 02/26/24 2233 -- -- -- 81 -- 100 % 02/26/24 2232 -- -- -- 85 -- 100 % 02/26/24 2231 -- -- -- 84 -- 100 % 02/26/24 2230 -- -- -- 86 -- 100 % 02/26/24 2229 -- -- -- 89 -- (!) 84 % 02/26/24 2228 -- -- -- 87 -- 100 % 02/26/24 2227 -- -- -- 81 -- 100 % 02/26/24 2226 -- -- -- (!) 94 -- 99 % 02/26/24 2225 -- -- -- 88 -- 100 % 02/26/24 2224 -- -- -- 84 -- 100 % 02/26/24 2223 -- -- -- 85 -- 100 % 02/26/24 2222 -- -- -- 84 -- 100 % 02/26/24 2221 -- -- -- 84 -- 100 % 02/26/24 2220 -- -- -- 84 -- 100 % 02/26/24 2219 -- -- -- 82 -- 100 %  02/26/24 2218 -- -- -- 85 -- 100 % 02/26/24 2217 -- -- -- 83 -- 100 % 02/26/24 2216 -- -- -- 82 -- 100 % 02/26/24 2215 -- -- -- 84 -- 100 % 02/26/24 2214 -- -- -- 84 -- 100 % 02/26/24 2213 -- -- -- 84 -- 100 % 02/26/24 2212 -- -- -- 84 -- 100 % 02/26/24 2211 -- -- -- 86 -- 100 % 02/26/24 2210 -- -- -- 85 -- 100 % 02/26/24 2209 -- -- -- 87 -- 100 % 02/26/24 2208 130/67 98.9 ?F (37.2 ?C) Temporal 87 18 100 % 02/26/24 2207 -- -- -- 86 -- 100 % 02/26/24 2206 -- -- -- 87 -- 100 % 02/26/24 2205 -- -- -- 87 -- 100 % 02/26/24 2204 -- -- -- 86 -- 100 % 02/26/24 2203 -- -- -- 85 -- 100 % 02/26/24 2202 -- -- -- 86 -- 100 % 02/26/24 2201 -- -- -- 86 -- 100 % 02/26/24 2200 -- -- -- 89 -- 100 % 02/26/24 2159 -- -- -- 87 -- 100 % 02/26/24 2158 -- -- -- 87 -- 100 % 02/26/24 2157 -- -- -- 88 -- 100 % 02/26/24 2156 -- -- -- (!) 93 -- 100 % 02/26/24 2155 -- -- -- (!) 94 -- 100 % 02/26/24 2154 -- -- -- (!) 94 -- 100 % 02/26/24 2153 (!) 156/58 -- -- (!) 95 18 100 % 02/26/24 2152 -- -- -- (!) 97 -- 100 % 02/26/24 2151 -- -- -- (!) 96 -- 100 % 02/26/24 2150 -- -- -- (!) 95 -- 100 % 02/26/24 2149 -- -- -- (!) 95 -- 100 % 02/26/24 2148 -- -- -- (!) 96 -- 100 % 02/26/24 2147 -- -- -- (!) 95 -- 100 % 02/26/24 2146 -- -- -- (!) 97 -- 100 % 02/26/24 2145 -- -- -- (!) 95 -- 100 % 02/26/24 2144 -- -- -- (!) 94 -- 100 % 02/26/24 2143 -- -- -- (!) 96 -- 100 % 02/26/24 2142 -- -- -- (!) 97 -- 100 % 02/26/24 2141 -- -- -- (!) 98 -- 100 % 02/26/24 2140 -- -- -- (!) 98 -- 100 % 02/26/24 2139 133/70 -- -- 88 18 100 % 02/26/24 2138 -- -- -- 82 -- 100 % 02/26/24 2137 -- -- -- 83 -- 100 % 02/26/24 2136 -- -- -- 83 -- 100 % 02/26/24 2135 -- -- -- 82 -- 100 % 02/26/24 2134 -- -- -- 86 -- 100 % 02/26/24 2133 -- -- -- 85 -- 100 % 02/26/24 2132 -- -- -- 84 -- 100 % 02/26/24 2131 -- -- -- 87 -- 100 % 02/26/24 2130 -- -- -- 89 -- 100 % 02/26/24 2129 -- -- -- 88 -- 100 % 02/26/24 2128 -- -- -- 89 -- 100 % 02/26/24 2127 -- -- -- 90 -- 100 % 02/26/24 2126 -- -- -- 88 -- 100 % 02/26/24 2125 -- -- -- 83 -- 100 % 02/26/24 2124 (!) 140/74 -- -- 90 18 100 % 02/26/24 2123 -- -- -- (!) 94 -- 100 % 02/26/24 2122 -- -- -- (!) 95 -- 100 % 02/26/24 2121 -- -- -- (!) 91 -- 100 % 02/26/24 2120 -- -- -- 90 -- 100 % 02/26/24 2119 -- -- -- 90 -- 100 % 02/26/24 2118 -- -- -- 89 -- 100 % 02/26/24 2117 -- -- -- 86 -- 100 % 02/26/24 2116 -- -- -- 86 -- 100 % 02/26/24 2115 -- -- -- 86 -- 100 %  02/26/24 2114 -- -- -- 86 -- 100 % 02/26/24 2113 -- -- -- 87 -- 100 % 02/26/24 2112 (!) 151/78 -- -- 78 18 100 % 02/26/24 2111 -- -- -- 81 -- 100 % 02/26/24 2110 -- -- -- 87 -- 100 % 02/26/24 2109 -- -- -- 87 -- 100 % 02/26/24 2108 -- -- -- 87 -- 100 % 02/26/24 2107 -- -- -- 89 -- 100 % 02/26/24 2106 -- -- -- (!) 94 -- 100 % 02/26/24 2105 -- -- -- 88 -- 100 % 02/26/24 2104 -- -- -- 90 -- 100 % 02/26/24 2103 -- -- -- (!) 91 -- 100 % 02/26/24 2102 -- -- -- (!) 93 -- 100 % 02/26/24 2101 -- -- -- (!) 96 -- 99 % 02/26/24 2100 -- -- -- (!) 95 -- 99 % 02/26/24 2059 -- -- -- 83 -- 99 % 02/26/24 2058 -- -- -- 83 -- 99 % 02/26/24 2057 -- -- -- 88 -- 100 % 02/26/24 2056 -- -- -- 87 -- 99 % 02/26/24 2055 -- -- -- 90 -- 99 % 02/26/24 2054 -- -- -- 89 -- 98 % 02/26/24 2053 133/69 -- -- 88 18 99 % 02/26/24 2052 -- -- -- 88 -- 99 % 02/26/24 2051 -- -- -- 86 -- 99 % 02/26/24 2050 -- -- -- 88 -- 99 % 02/26/24 2049 -- -- -- 88 -- 99 % 02/26/24 2048 134/76 98.5 ?F (36.9 ?C) Temporal 87 18 99 % 02/26/24 2047 -- -- -- 88 -- 99 % 02/26/24 2046 -- -- -- -- -- 99 % 02/26/24 1739 -- (!) 101.3 ?F (38.5 ?C) Oral -- -- -- 02/26/24 1711 133/62 (!) 100.8 ?F (38.2 ?C) Oral (!) 93 16 98 % 02/26/24 1513 132/62 98.3 ?F (36.8 ?C) Temporal (!) 91 17 98 % 02/26/24 1302 123/68 97.8 ?F (36.6 ?C) Temporal (!) 98 17 98 % 02/26/24 1042 137/63 97.8 ?F (36.6 ?C) Temporal (!) 97 18 98 % 02/26/24 0917 131/71 98 ?F (36.7 ?C) Temporal (!) 106 17 98 % 02/26/24 0730 124/61 97.4 ?F (36.3 ?C) Temporal (!) 96 17 98 % 02/26/24 0659 129/60 98.7 ?F (37.1 ?C) Temporal (!) 96 18 97 % Physical Exam: General: NAD Abdomen: Soft, appropriately tender, nondistendedIncision: Dressing clean dry and intact without erythema, edema or drainage. To be removed later today.Uterine Fundus: firm, but difficult to palpate iso habitus LE: Warm and well perfused. Trace edema without tenderness, erythema, or cordsReview of Labs:Recent Labs Lab 02/02/260010 02/03/261929 WBC 7.8 16.6* HGB 12.1 9.3* HCT 35.20 28.90* MCV 84.2 85.3 PLT 289 225 Lab Results Component Value Date  HCT 28.90 (L) 02/26/2024  PLT 225 02/26/2024  CREATININE 1.02 (H) 02/26/2024  CREATININE 1.02 (H) 02/26/2024  ALT 9 02/26/2024  AST 24 02/26/2024 Lab Results Component Value Date  RH POS 02/25/2024 Lab Results Component Value Date  RUBELLAIGG 4.78 08/10/2023 Assessment & Plan 39 y.o. [id number] POD#1 (date of delivery: 02/26/24)  s/p pLTCS at [redacted]w[redacted]d for NFHT RFD iso eIOL. Surgery was ccb PPH iso uterine atony and hysterotomy bleeding (EBL 1500cc, + methergine + hemabate  + miso PR). She is now HDS and asx. Pt also with hx elevated BP w/o dx of cHTN, now ruling in for gHTN. She has not been started on long acting antihypertensives and is now normotensive. She remains asx.  Routine Postpartum/Postoperative Care-Antepartum Hct 35.20  --> EBL 1500 cc + methergine + hemabate  + miso PR  --> intra-op Hct 28.3 > POD#1 Hct 28.9 -Pain: tylenol / motrin  and oxycodone  prn -Regular  diet-Rh positive. Rhogam not indicated-Rubella immune. MMR vaccine not indicated-Breast feeding support provided as needed-Birth control plan: [] -Circumcision:  N/A-DVT ppx: encourage ambulation, lovenox  for Pre-pregnancy BMI >30 (53.42)-Surgical ppx: Keflex /Flagyl  x48hrsOther Active Issues39 y.o. [id number] POD#__(2/3) s/p pltcs at [redacted]w[redacted]d for NRFHTFemale Infant, Apgars /, Wt [id number] Dx: eIOLAntepartum Hct 35.20  --> EBL 1500 cc + methergine + hemabate  + miso PR  --> intra-op Hct 28.3 > POD#1 Hct **RH: posRubella: ImmuneFeeding:BCM:Circ status:PMHx:  has a past medical history of Abnormal uterine bleeding, Amenorrhea, Medical history non-contributory, Obesity, Prediabetes, and STD (sexually transmitted disease).PSHx:  has a past surgical history that includes No past surgeries.All: No Known AllergiesHome Meds: No current facility-administered medications on file prior to encounter.Current Outpatient Medications on File Prior to Encounter:drospirenone-ethinyl estradioL (YASMIN) 3-0.03 mg per tablet, Take 1 tablet by mouth daily., Disp: 84 tablet, Rfl: 4Group: YHPAcute blood loss anemia- iso uterine atony and hysterotomy bleeding- Intra-op Hgb 9.3[]  POD#1 hctIAI- sp amp, 1x tobra in PACUAKI- 2/3 Cr. 1.02 intra-op (baseline 0.6-0.7), likely pre-renal[]  AM BMPElevated BP w/o dx of cHTN- on adler review 2x mrBP over 5 years- no medsSMA carrier- FOB not tested          Abnormal Pap- 02/2023 +HRHPV, NILM[]  due in 1 yr, PP pap Dispo:-Likely d/c home POD#3 or 4 pending attending approval and recovery__________________________Allysa SHAUNNA Bohr, MD 2/4/2026Attending Addendum:

## 2024-02-27 NOTE — Anesthesia Postprocedure Evaluation [25]
 Anesthesia Post-op NotePatient: Amy K WilliamsProcedure(s):  Procedure(s) (LRB):CESAREAN DELIVERY ONLY (N/A) Last Vitals:  I have reviewed the post-operative vital signs as noted in the Epic chart.POSTOP EVALUATION:      Patient Recovery Location:  PACU     Vital Signs Status:  Stable     Patient Participation:  Patient participated     Mental Status:  Awake and alert     Respiratory Status:  Acceptable     Airway Patency:  Patent     Cardiovascular/Hydration Status:  Stable     Pain Management:  Satisfactory to patient     Nausea/Vomiting Status:  Satisfactory to patientNo notable events documented.

## 2024-02-27 NOTE — Anesthesia Procedure Notes [28]
 Room and Bed: 486 OR1 , 486-OR1  Current Location: 486-OR1 Anesthesia Block:Date/Time: 02/26/2024 8:28 PM TAPPain Diagnosis/Location: CSECTIONThis block procedure is being performed for post-operative pain management at the request of:      Requesting Physician: Sheilda Poe, MDPre-procedure Checklistpatient identified; site marked; surgical consent reviewd; pre-op evaluation performed; IV checked; monitors and equipment checked; informed consent obtained and options, plan, risks & benefits discussed with patientUniversal ProtocolUniversal protocol documented by nurse: noTime out unable to be performed due to emergent nature of case: NoTiming: the time out is initiated after the patient is positioned and prior to the beginning of the procedure: YesName of patient and medical record number or DOB (if MRN is unavailable) stated and checked with ID band or        previously confirmed MEDICAL RECORD NUMBERYesProceduralist states or confirms the procedure to be performed: YesThe procedural consent is used to verify the procedure to be performed: YesSite of procedure(s) (with laterally or level) is topically marked per policy and visible after draping: N/ARadiographic imaging is present or a laterality side/site band is present on patient and is accessible: N/AMedications addressed: YesBlood Addressed: YesImaging addressed: YesImplants addressed: YesSpecial equipment or other needs addressed: YesProceduralist discusses particular challenges or special considerations: YesBaseline Neurological Deficits:NonePre-op Anti-coagulation Ldz:WnwzEjupzwu'd pre-procedure mental status:  AwakePrep: chloraprep and patient drapedNeedle A:  TAPInjection technique: single-shotNeedle: Short-bevel      Gauge: 20 G      Length: 4 inTechnique: Ultrasound guided     - Imaging guidance was used to decrease the risk of complications. A permanent image has been stored in the patient's medical record. The needle tip was noted to be adjacent to the nerve/plexus identified. Appropriate spread of the medication was noted in real time. There was no ultrasound evidence of intravascular and/or intraneural injection. Event(s): blood not aspirated, injection not painful, no injection resistance, no paresthesia and no other eventPerformed By:     - Anesthesiologist: Blas Maude Faden, MD, with Attending Anesthesiologist and with resident      - Resident: Raniya Golembeski Cain, MDPatient's position for procedure:supineLaterality:  bilateralOutcome:  successful blockAttempts:  block completePatient tolerated block procedure well?:  yes

## 2024-02-27 NOTE — Lactation Note [42]
 [facility]		OB Lactation Consult Note INTERPRETER: Not neededDyad Feeding Information: 39 y.o. F G3P1021 currently PPD# 1 s/p c-section delivery at [redacted]w[redacted]d Information for the patient's newborn:  Starlyn, Droge [[id number]] DeliveryC-Section, Low Transverse Reason for consultation: NNICU admission;PrimiparaChief Complaint: Mom plans to breastfeed and give breastmilk and formula if needed. Infant has been getting formula in the NNICU. 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  Encounter for induction of labor  S/P emergency C-section  Cesarean delivery delivered Past Surgical History[1] Last 24 hours#BF-- 1#FF-- every feeding every 3 hrs.#EBM-- 0Latch score-- Score: 6Baby Information: % Weight Change: Information for the patient's newborn:  Ashleah, Valtierra [[id number]] 0% Bilirubin: TCB will be done @ 24 hrs oldNumber of voids in 24 hrs:  see NNICU flowsheetNumber of stools in 24 hrs: see NNICU flowsheetEmesis: See NNICU flowsheetPhysical Exam: Lactating ParentBreast:large breastsRight Nipple:WNLLeft Nipple:WNLColostrum:Had not pumped or hand expressed since deliveryBaby in NNICU Supplemental method:bottle Pumping Assessment: Pump used: medela symphony in room but not set up yetFlange size: a new electric breast pump through insurance for this pregnancy? YesMedelaLactation Evaluation: Lactation risk related to Lactating Parent: PCOS, separation from infant, AMA, and BMI > than 35Lactation risks related to Delivery: None Lactation risks related to infant: NICU admission Medication:  reviewed medication    Discussed with:  parentsCurrent Medications:Current Facility-Administered Medications Medication Dose Route Frequency Last Rate  acetaminophen   975 mg Oral Q6H    ascorbic acid  (vitamin C )  250 mg Oral Every Other Day    benzocaine -menthol   1 spray Topical (Top) 4x DAILY PRN    cephALEXin   500 mg Oral Q8H    diphenhydrAMINE   25 mg Oral Q4H PRN     enoxaparin  prophylaxis dosing  40 mg Subcutaneous Q12H    ferrous sulfate   325 mg Oral Every Other Day    witch hazel  1 each Topical (Top) Q1H PRN    hydrocortisone    Topical (Top) BID PRN    ibuprofen   600 mg Oral Q6H    lactated ringers   125 mL/hr Intravenous Continuous 125 mL/hr (02/26/24 2340)  lanolin   Topical (Top) Q2H PRN    loperamide   2 mg Oral 4x DAILY PRN    metroNIDAZOLE   500 mg Oral Q8H    ondansetron   4 mg Oral Q6H PRN    Or  ondansetron  (PF)  4 mg IV Push Q6H PRN    oxyCODONE   2.5 mg Oral Q4H PRN    oxyCODONE   5 mg Oral Q4H PRN    oxytocin  IV infusion - Post Partum  83.3 milli-units/min Intravenous Continuous Stopped (02/26/24 2224)  polyethylene glycol  17 g Oral Daily    prenatal chewable vitamin  1 tablet Oral Daily    senna  1 tablet Oral DAILY PRN    simethicone   125 mg Oral Q6H PRN   Per MAR review pt has taken 0 mg Oxycodone  in last 24hrs.Continue to provide breast milk or breastfeed at breast as long as dosing remains 60mg /24hrs or underSee below per [employer] reference. https://www.[employer].com/monographs/61327?q=oxycoOXYCODONETrade: Endone, Oxecta, Oxy IR, OxyContin , Oxyfast, Proladone, SupeudolCategory: AnalgesicL3 - Limited Data-Probably CompatibleOxycodone is similar to hydrocodone and is a mild analgesic somewhat stronger than codeine. Small amounts are secreted in breast milk. The data suggest that oxycodone  may pose a greater risk of causing infant sedation and that this risk is dose related. The use  of doses greater than 60 mg/day are discouraged in opiate naive breastfeeding mothers. Higher doses maybe acceptable in breastfeeding mothers whom received opiates regularly during their pregnancy.T ?	2-4 hM/P	3.4Tmax	1-2 hPB	45%MW	351Oral	60%-87%Vd	2.6pKa	8.5RID	1.01%-4.55%Adult ConcernsSedation, respiratory depression/apnea, nausea, vomiting, constipation, urinary retention, weakness.Adult Dose5-10 mg every 6 hours as needed.Pediatric ConcernsMultiple case reports and studies have reported sedation (20.1%), difficulty breathing, and difficulty feeding in infants exposed to oxycodone  via breast milk; especially at doses higher than 30 mg/day (median = 0.4 mg/kg/day).[3-5]Infant MonitoringSedation, slowed breathing rate, pallor, difficulty feeding, constipation, and appropriate weight gain.AlternativesAcetaminophen, NSAIDs, hydromorphone . Education Provided: Counseling:pumping and paced bottle feeding LC went back in to set up the pump for Mom and she was sleeping. Pump and attachments are at the bedside. RN states she will assist when she wakes up. She was in NNICU later in the afternoonPlan/Recommendations: 1.  Monitor: When infant comes down from NNICU  latch score and pumping2.  Plan: Plans to breastfeed and give formula if needed. Encourage paced bottle feeding3.  Intervention:  hand express and/or, pump 8 x in 24 hrs if infant in NNICU and/or supplementing, and supplement with EBM or formula if needed4.  Follow up: [facility] tomorrow NICU Breast Pumping PlanRefer to 'Understanding Postpartum Health and Baby Care' Book (Pages 32-46) Hold baby skin-to-skin when possible Wash hands before pumping and pump parts after each use with soap and waterExpress your breastmilk at least 8 times in 24 hoursMassage breasts before and during expressing milk Pump for 15-20 minutes (consider using/making a hands free pumping bra)Hand express after pumping for a few minutes Label any breastmilk collected (get labels from Milk Room outside [facility] NICU)Bring any breastmilk to the baby?s fridge for the first 7 days (Milk Room after 1st week) While baby in NICU use their storage guidelines for breastmilk  If you have questions or concerns about pumping, ask to see one of the lactation consultants.Breastfeed baby as soon as possible and as often as possible Make sure you have a breast pump orderedReview Newborn ICU Lactation Packet When Infant comes down from NNICU:Well Baby Breastfeeding PlanRefer to 'Understanding Postpartum Health and Baby Care' Book (Pages 32-46) Keep baby skin-to-skin (unless you are sleepy).Keep baby in bassinet next to side of mother's bed when everyone is sleeping.Feed baby based on feeding cues (at least 8 or more in 24 hours).Latch baby- Hold baby close, tummy-to-tummy, nose-to-nipple, chin touching breast.Wait for baby to open wide, bring baby to you.If latch is painful, take baby off and re-latch OR pull down on baby's chin while the baby is sucking. Keep baby sucking while feeding- talking, touching your baby, compressing breast.Listen for swallowing which sounds like ?kah.When baby comes off or stops sucking, offer other breast.Look at your baby, not the clock, to know when baby is done. Write down feedings and diaper changes in the Breastfeeding Log. (Page 46)*If latch is painful, baby is not latching, or latching less than 5 minutes- Call your Nurse or PCA for help. **Pump for 15-20 minutes & hand-express and give baby your breast milk if baby did not breastfeed well, or if doing nipple rest for sore nipple, or if supplementing with donor milk or formula, in order to protect your milk supply. Signed: Olivia Constant, RN, IBCLC2/4/20266:15 PM [1] Past Surgical History:Procedure Laterality Date  NO PAST SURGERIES

## 2024-02-27 NOTE — Progress Notes [1]
 OB Anesthesia Postpartum evaluation Post partum day #: 1S/P C-section without laborAnesthesia: epiduralVital signs:Vitals:  02/27/24 0812 BP: 110/75 Pulse: 76 Resp: 18 Temp: 36.7 ?C Patient denies Headache: YesPatient denies back pain : YesAmbulates without problem: YesPatient has normal sensation in bilateral extremities: YesPatient denies pruritus: YesAssessment: No complications from anesthesia Plan: No further intervention neededElectronically Signed:Bayo BelloAnesthesiology Resident PGY-3Available via MHB2/4/202612:29 PM

## 2024-02-27 NOTE — Plan of Care [1000001]
 Plan of Care Overview/ Patient [phone] yo G3P1 @ 41+1. Patient s/p primary unscheduled cesarean delivery for NRFHT. Cord gases sent. Assumed care of patient in OR after delivery, newborn to NNICU for 6 hour Chorio hold. TMAX 101.3. EBL 1,500 ML. Multiple meds given during surgery for uterine atony, Methergine & Hemabate  given by anesthesia team. Surgicel and surgicel snow used during case. Rectal miso placed after case complete. See MAR for med times. STAT labs sent, H&H dropped significantly from baseline labs, plan to repeat in AM. IV patent infusing LR and Pitocin  per protocol. IM Pit given in OR. IV Tobramycin  given per MAR. Foley to gravity draining dark yellow/orange urine. Surgical pressure dressing CDI. Fundus unable to be palpated due to habitus. Moderate rubra lochia. TAP blocks completed in OR.  Plan [facility] after visiting baby Daya in NNICU. Breast feeding initiated in NNICU. FOB and mother at bedside supportive of patient. Tolerated food well. See flowsheets for further details. Continue plan per IPOC. Electronically Signed by Andrea CHRISTELLA Hick, RN, February 26, 2024

## 2024-02-27 NOTE — Plan of Care [1000001]
 Plan of Care Overview/ Patient StatusProblem: Adult Inpatient Plan of CareGoal: Plan of Care ReviewOutcome: Interventions implemented as appropriatePt. Amy Paul is a G3P0 at 41+0wks admitted to [facility] for PD IOL. Pt received Miso during shift, fetal decel, provider notified of Miso admin. Timing  (see provider notification), terbutaline  administered see MAR. EFM/TOCO tracing returned to normal limits. 80-60 cook second attempt placed, CE unchanged prior to placement. Pt expecting baby girl Amy Paul, FOB at bedside Amy Paul. Safety maintained. Pt transferred to labor floor.

## 2024-02-28 ENCOUNTER — Telehealth: Admit: 2024-02-28 | Payer: PRIVATE HEALTH INSURANCE | Primary: Family Medicine

## 2024-02-28 MED FILL — OXYCODONE IMMEDIATE RELEASE 5 MG TABLET: 5 mg | ORAL | 2 days supply | Qty: 8 | Fill #0 | Status: AC

## 2024-02-28 NOTE — Plan of Care [1000001]
 Plan of Care Overview/ Patient StatusPt is a PPD #2 C/S being discharged home. Refer to flow sheet for vs and assessments. Follow up with OB reviewed with pt. PP self and infant care reviewed. Reviewed s/s to be aware of to contact OB and/or pediatrician. Reviewed prescriptions sent to pharmacy. Pt ambulating well, voiding spontaneously, and bleeding WNL. Abdominal incision is WNL. Pumping and bottle feeding baby girl. All education complete. All goals met.Problem: Adult Inpatient Plan of CareGoal: Plan of Care ReviewOutcome: Outcome(s) achieved Discharge instructions and ?Understanding Mother & Baby Care? booklet reviewed with patient.Educated about the importance of infant's safe sleep environment, including back to sleep, no extra blankets or loose bedding, no stuffed animals or toys in crib and placing infant in his/her own bassinet or crib for sleep.

## 2024-02-28 NOTE — Telephone Encounter [36]
 Please schedule 2 week telephone visit with a nurse for a postpartum check and an in-person visit in 6 weeks with Dr. Lamon You,Dr. Waldemar Capri, [facility]

## 2024-02-28 NOTE — Plan of Care [1000001]
 Plan of Care Overview/ Patient StatusProblem: Adult Inpatient Plan of CareGoal: Plan of Care ReviewOutcome: Interventions implemented as appropriateFlowsheets (Taken 02/28/2024 0543)Progress: improvingPlan of Care Reviewed With: patient Problem: Adult Inpatient Plan of CareGoal: Patient-Specific Goal (Individualized)Outcome: Interventions implemented as appropriateFlowsheets (Taken 02/28/2024 0043)What Anxieties, Fears, Concerns or Questions Do You Have About Your Care?: NONE AT PRESENT, PT WILL ASK QUESTIONS AS THEY ARISEIndividualized Preferences and Care Needs: ROOMING IN WITH INFANT AND COMBINATION FEEDING ADMINISTER SCHEDULED PAIN MEDS ATC TO MAINTAIN COMFORT AND OFFER PRN PAIN MEDS AS NEEDED AND PROVIDE COMFORT MEASURES PRNPatient Centered Long Term Goal: PT WILL DEMONSTRATE INDEPENDECE WITH SELF CARE S/P C-SECTION DELIVERY AND NEWBORN CARE PRIOR TO DISCHARGE.Patient Centered Daily Goal: MAINTAIN STABLE POSTPARTUM RECOVERY AND ADEQUATE PAIN MANAGEMENT DURING HOSPITAL STAY BONDING WITH NEWBORN AND PERFORMING INFANT CARE DURING HOSPITAL STAY Problem: Adult Inpatient Plan of CareGoal: Absence of Hospital-Acquired Illness or InjuryOutcome: Interventions implemented as appropriateIntervention: Identify and Manage Fall RiskFlowsheets (Taken 02/28/2024 0043)Universal Fall Prevention: keep room clutter free with open pathway to the bathroom enhance night-time vision with night or bathroom lights nonskid shoes/slippers when out of bed safety round/check completedIntervention: Prevent and Manage VTE (Venous Thromboembolism) RiskNote: AMBULATION ENCOURAGED AND ADMINISTER LOVENOX  Q12H AS ORDERED BY PROVIDERIntervention: Prevent InfectionNote: ADMINISTER ABX AS ORDERED BY PROVIDER Problem: BreastfeedingGoal: Effective BreastfeedingOutcome: Interventions implemented as appropriateIntervention: Promote Effective BreastfeedingFlowsheets (Taken 02/28/2024 0043)Parent-Child Attachment Promotion: rooming-in promotedNote: PUMPING TO ESTABLISH ADEQUATE BREAST MILK SUPPLY AND SUPPLEMENTING WITH FORMULA Problem: Postpartum (Cesarean Delivery)Goal: Successful Parent Role TransitionOutcome: Interventions implemented as appropriateIntervention: Support Parent Role TransitionFlowsheets (Taken 02/28/2024 0543)Supportive Measures: active listening utilized goal-setting facilitated decision-making supported verbalization of feelings encouraged (23;00-07;00) POST OP DAY 2 S/P C-SECTION DELIVERY. VSS, AFEBRILE, AND INVOLUTIONAL ASSESSMENT WNLS DURING THE NIGHT AS EVIDENCED BY FLOWSHEET. PRESSURE DRESSING CLEAN, DRY, AND INTACT. PT PASSING GAS AND VOIDING W/O DIFFICULTY. PT REPORTS HAD BM AND AMBULATING TO AND FROM BR W/O LIGHTHEADEDNESS OR DIZZINESS. PAIN ADEQUATELY MANAGED WITH CURRENT REGIMEN PER PT REPORT. INFANT ROOMING  IN WITH PT. FOB PRESENT AND SUPPORTIVE. PT PROGRESSING TOWARDS DSIRED GOALS.Educated about the importance of infant's safe sleep environment, including back to sleep, no extra blankets or loose bedding, no stuffed animals or toys in crib and placing infant in his/her own bassinet or crib for sleep.Educated about the benefits of skin to skin contact including maintaining infant warmth, regulating infant blood glucose, regulating infant respirations and heart rate, maternal infant bonding and improved outcomes. Educated about infant safety during skin to skin contact.

## 2024-02-28 NOTE — Plan of Care [1000001]
 Problem: Adult Inpatient Plan of CareGoal: Plan of Care ReviewOutcome: Interventions implemented as appropriateFlowsheets (Taken 02/27/2024 0036 by Rayma Eppie GAILS, RN)Plan of Care Reviewed With: patient significant other Plan of Care Overview/ Patient StatusPOD 1 s/p csection at 41+1. VSS, pain well managed. Breast/formula feeding. Pumping encouraged. Baby down from NNICU at 2115. Abdominal dsg CDI, fundus firm. Light vaginal bleeding. + flatus, + bm. Voiding. Ambulating in room. Tolerating PO. Progressing well

## 2024-02-28 NOTE — Discharge Summary [5]
 [facility]	OB Physician Discharge SummaryPatient Data: Patient Name: Amy Paul Age: 39 y.o. DOB: February 19, 1985 MRN: [id number] Date of admission: 02/24/2024     Time:  8:22 PMDate of discharge:  2/5/2026Admitting Physician: Edna EMERSON Baker, MD Admission Diagnosis: Encounter for induction of labor [Z34.90]Cesarean delivery delivered [O82]EDC:  Estimated Date of Delivery: 1/26/26Delivery Date and Time / Delivery Type / / Gestational AgeInformation for the patient's newborn:  Sahar, Ryback [[id number]] Delivery:    02/26/2024 7:05 PM by  C-Section, Low TransverseSex:  female Gestational Age: [redacted]w[redacted]d      APGARS  One minute Five minutes Ten minutes Totals: 5  9    Resuscitation: Resuscitation Comment: Peds called to attend primary c-section delivery by Dr. 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 ROM approx 12 hours PTD with clear fluid that turned meconium after approx 3 hours. GBS neg, Hep B neg, HIV neg, VDRL/RPR NR, Rubella immune.Infant emerged vertex without spontaneous cry. No DCC, brought to radiant warmer to be dried, stimulated and bulb suctioned. HR>100.  With stimulation infant with strong cry and improving tone.  Continued routine resuscitation. Infant now with good tone, pink in RA.Physical ExamGeneral: Alert, crying, in NADHEENT: AFOSF, nares patent, palate intactRespiratory:  No tachypnea, grunting, retracting or nasal flaring.  Good air entry bilaterally Cardiovascular:  RRR, no murmur, 2 + femoral and brachial pulses, brisk cap refill Abdomen: Soft, non-distended, no masses, 3 vessel cordGI/GU: Normal external female genitalia.  Anus patent. Spine: Straight, No sacral dimple	Skeletal: MAE, clavicles intact, no notable crepitus Skin: clear, no lesionsNeuro:  Good tone, +moro, +grasp, no focal deficitA/P: 41 1/7 week female infant born by primary c-section delivery, transitioning to extrauterine life well.  Due to concern for maternal chorioamnionitis, infant to NNICU for 24 hour monitoring and blood culture. -spoke with mother and father about plan.  Mother would like to BF but is okay with formula if needed.  Mother declined DBM.Lauraine Show PA-C Newborn Measurements:Weight: 7 lb 11.5 oz (3500 g)  Other providers:      Principle Diagnosis: Encounter for induction of laborSecondary Diagnosis:  S/p cesarean delivery                Antepartum complications: arrest of dilation at 9cm, chorioamnionitisHospital Course:  Pt was admitted for IOL and progressed to 9cm but had arrest in active phase and was diagnosed with intraamniotic infection and subsequently underwent a pLTCS c/b PPH. By POD 2 she was meeting milestones and cleared for dischargeNot clinically significantAcute Blood Loss Anemia  Consults:none  Past Medical History Past Surgical History Past Medical History[1] Past Surgical History[2] Social History Family History Social History Tobacco Use  Smoking status: Never  Smokeless tobacco: Never Substance Use Topics  Alcohol use: Not Currently   Comment: occasional  Family History Problem Relation Age of Onset  No Known Problems Mother   Lymphoma Father   Cancer Father   Diabetes Maternal Grandmother   Uterine cancer Paternal Aunt 30  Diabetes Paternal Grandmother   Cancer Paternal Aunt   Cancer Paternal Aunt   Heart attack Neg Hx   Stroke Neg Hx   Thrombophilia Neg Hx   Breast cancer Neg Hx   Ovarian cancer Neg Hx   Allergies Allergies[3] Discharge Exam: Physical ExamDischarged Condition: good           Discharge Type: Cesarean Section Delivery    Discharge Disposition:  Home Feeding method at  discharge:  BreastmilkContraception method choice:  None Immunizations: Immunization History Administered Date(s) Administered Comments  COVID-19 Vaccine - PFIZER 05/24/2019    06/18/2019   COVID-19, PFIZER 12Y up,mRNA,TRIS 05/05/2020   HPV9 03/21/2023    05/21/2023   Influenza, unspecified formulation 03/06/2018   Tdap 03/27/2018    12/04/2023   Varicella, live 05/17/2020    06/16/2020  Discharge Medications:Current Discharge Medication List  START taking these medications  Details acetaminophen  (TYLENOL ) 325 mg tablet Take 2 tablets (650 mg total) by mouth every 6 (six) hours as needed for pain. Decrease to 325mg  every 6 hours as needed as you feel betterStart date: 02/28/2024  ibuprofen  (ADVIL ,MOTRIN ) 200 mg tablet Take 3 tablets (600 mg total) by mouth every 6 (six) hours as needed for pain for up to 10 days. You can decrease to 2 tabs every 6 hours and then 1 tab every 6 hours as you feel betterStart date: 02/28/2024, End date: 03/09/2024  oxyCODONE  (ROXICODONE ) 5 mg Immediate Release tablet Take 1 tablet (5 mg total) by mouth every 4 (four) hours as needed for pain.Qty: 8 tablet, Refills: 0Start date: 02/28/2024   CONTINUE these medications which have NOT CHANGED  Details !! breast pump Devi AS DIRECTEDQty: 1 each, Refills: 0  Breast pump Use as directed. Double electric breast pump ICD10 092.70.Qty: 1 each, Refills: 0  drospirenone-ethinyl estradioL (YASMIN) 3-0.03 mg per tablet Take 1 tablet by mouth daily.Qty: 84 tablet, Refills: 4  !! metroNIDAZOLE  (FLAGYL ) 500 mg tablet TAKE 1 TABLET (500 MG TOTAL) BY MOUTH 2 (TWO) TIMES DAILY FOR 7 DAYS. *AVOID ALCOHOL FOR 9 DAYS*Qty: 14 tablet, Refills: 0  !! metroNIDAZOLE  (FLAGYL ) 500 mg tablet TAKE 1 TABLET (500 MG TOTAL) BY MOUTH 2 (TWO) TIMES DAILY FOR 7 DAYS. (AVOID ALCOHOL)Qty: 14 tablet, Refills: 0  !! PUMP IN STYLE ADVANCED Devi    !! - Potential duplicate medications found. Please discuss with provider.  Early Discharge:  NO Plan:  in 2 weeksElectronically Signed:Jasmen Emrich, MD2/5/20269:42 AMAddendum :  [1] Past Medical History:Diagnosis Date  Abnormal uterine bleeding   Amenorrhea   Medical history non-contributory   Obesity   Prediabetes   STD (sexually transmitted disease)   chlamydia 2007 [2] Past Surgical History:Procedure Laterality Date  NO PAST SURGERIES   [3] No Known Allergies

## 2024-02-28 NOTE — Discharge Instructions [18]
 General Instructions from the [facility] Team at [facility]:You can resume your regular diet. Be sure to drink plenty of fluids throughout the day.Get plenty of rest. We recommend take naps when your baby naps.You can use ibuprofen  and acetaminophen  for pain and cramping as needed up to every 6 hours.You can also use oxycodone  if the above does not help. Try to minimize use as it causes constipation and can be addicting.You may want to take a stool softener daily to help avoid constipation.It is safe to take a laxative such as Miralax  every couple of days (or even daily if needed) if you have constipation.Avoid lifting anything more than 10 lbs for several weeks.Walk as much as you are able. Avoid vigorous exercise until you have your follow-up appointment.No intercourse for 6 weeks.Anticipate bleeding for 4 to 6 weeks. The bleeding may vary from red to brown. Call us  if it is heavy, such as soaking a maxipad per hour for two hours.Use only pads (not tampons) for the bleeding.It is fine to shower with the surgical tapes in place. Blot them dry after you shower. No tub baths until your bleeding stops.Remove the surgical tapes in 2 weeks if they have not fallen off by that time.Do not drive yourself for about 2 weeks or until the incisional pain is minimal and you are comfortable operating the car. Do not drive you are taking narcotic medication.Call our office to schedule your postpartum appointments.Call us  at [phone] for any questions or concerns. Nursing Patient Discharge InstructionsPatient reason for hospital stay:Childbirth - Cesarean SectionAttending Physician: Sheilda Poe, [facility] Your Doctor if:If you have bleeding, swelling, redness, increased warmth, increased drainage, more pain/discomfort from the wound, If you have trouble breathing, fast or slow heart beat, dizziness that does not go away after resting for 15 minutes, If you have more angina/chest pain or if your normal angina/chest pain changes, If you have nausea or poor appetite, If you have weight gain of 2-3 pounds or more a day, or swelling in your ankles, legs or abdomen. Weigh yourself daily, If you have more menstural bleeding than usual, If you have blood in your urine or stool, if you bleed easily, bleeding from gums, mouth, vagina, or rectum, If you have temperature (fever) higher than 100.4 degrees farenheit (F), and If you are feeling more tired or weak than usualCall Your Obstetrician/Midwife pq:Qzczm 100.4 degrees or higher, Burning or pain when using the bathroom, Heavy vaginal bleeding (flow), Smelly vaginal flow, Oozing (discharge), swelling, redness around your stitches, Your stitches open, You have a red, hot or tender area on either breast/chest, Sharp pains in your abdomen, breast or chest, Blurry vision or dizziness, Headache that does not go away, If you feel very sad or depressed, and If you have thoughts of harming yourself or babyPatient Care Instructions:Be sure all caregivers wash their hands, Keep dressing or suture line clean and dry, and Monitor incision for redness, odor, drainage, swellingPatient Information:Understanding Postpartum Health and Baby Care Book, Turning Heads, and Read to growAfter discharge from this program, if you experience thoughts of suicide, please use one of the following sources for help: Call your outpatient Psychiatric provider(s): for example, psychiatrist,therapist, and/or home service; Call 988 Suicide Help Hotline and request suicide prevention and crisis intervention: Call the [employer] at ([phone] (TALK): Go to your local hospital emergency department
